# Patient Record
Sex: Male | Born: 1940 | Race: White | Hispanic: No | Marital: Married | Smoking: Never smoker
Health system: Southern US, Community
[De-identification: ages and names within clinical notes are randomized; demographics above are authoritative.]

## PROBLEM LIST (undated history)

## (undated) DIAGNOSIS — E785 Hyperlipidemia, unspecified: Secondary | ICD-10-CM

## (undated) DIAGNOSIS — E669 Obesity, unspecified: Secondary | ICD-10-CM

## (undated) DIAGNOSIS — F32A Depression, unspecified: Secondary | ICD-10-CM

## (undated) DIAGNOSIS — E119 Type 2 diabetes mellitus without complications: Secondary | ICD-10-CM

## (undated) DIAGNOSIS — M1990 Unspecified osteoarthritis, unspecified site: Secondary | ICD-10-CM

## (undated) DIAGNOSIS — E1140 Type 2 diabetes mellitus with diabetic neuropathy, unspecified: Secondary | ICD-10-CM

## (undated) DIAGNOSIS — I10 Essential (primary) hypertension: Secondary | ICD-10-CM

## (undated) DIAGNOSIS — F329 Major depressive disorder, single episode, unspecified: Secondary | ICD-10-CM

## (undated) DIAGNOSIS — G4730 Sleep apnea, unspecified: Secondary | ICD-10-CM

## (undated) DIAGNOSIS — G8929 Other chronic pain: Secondary | ICD-10-CM

## (undated) DIAGNOSIS — M791 Myalgia, unspecified site: Secondary | ICD-10-CM

## (undated) DIAGNOSIS — E079 Disorder of thyroid, unspecified: Secondary | ICD-10-CM

## (undated) HISTORY — DX: Other chronic pain: G89.29

## (undated) HISTORY — DX: Major depressive disorder, single episode, unspecified: F32.9

## (undated) HISTORY — DX: Disorder of thyroid, unspecified: E07.9

## (undated) HISTORY — DX: Type 2 diabetes mellitus without complications: E11.9

## (undated) HISTORY — DX: Obesity, unspecified: E66.9

## (undated) HISTORY — DX: Depression, unspecified: F32.A

## (undated) HISTORY — DX: Essential (primary) hypertension: I10

## (undated) HISTORY — DX: Myalgia, unspecified site: M79.10

## (undated) HISTORY — DX: Unspecified osteoarthritis, unspecified site: M19.90

## (undated) HISTORY — DX: Hyperlipidemia, unspecified: E78.5

## (undated) HISTORY — DX: Sleep apnea, unspecified: G47.30

## (undated) HISTORY — DX: Type 2 diabetes mellitus with diabetic neuropathy, unspecified: E11.40

---

## 1997-10-17 ENCOUNTER — Ambulatory Visit (HOSPITAL_COMMUNITY): Admission: RE | Admit: 1997-10-17 | Discharge: 1997-10-17 | Payer: Self-pay | Admitting: Specialist

## 1997-10-24 ENCOUNTER — Ambulatory Visit (HOSPITAL_COMMUNITY): Admission: RE | Admit: 1997-10-24 | Discharge: 1997-10-24 | Payer: Self-pay | Admitting: Specialist

## 2000-07-20 ENCOUNTER — Encounter: Admission: RE | Admit: 2000-07-20 | Discharge: 2000-07-20 | Payer: Self-pay | Admitting: Orthopedic Surgery

## 2000-07-20 ENCOUNTER — Encounter: Payer: Self-pay | Admitting: Orthopedic Surgery

## 2000-12-02 ENCOUNTER — Encounter: Payer: Self-pay | Admitting: Neurosurgery

## 2000-12-06 ENCOUNTER — Encounter: Payer: Self-pay | Admitting: Neurosurgery

## 2000-12-06 ENCOUNTER — Inpatient Hospital Stay (HOSPITAL_COMMUNITY): Admission: RE | Admit: 2000-12-06 | Discharge: 2000-12-06 | Payer: Self-pay | Admitting: Neurosurgery

## 2003-05-01 ENCOUNTER — Inpatient Hospital Stay (HOSPITAL_COMMUNITY): Admission: EM | Admit: 2003-05-01 | Discharge: 2003-05-04 | Payer: Self-pay | Admitting: Emergency Medicine

## 2003-06-27 ENCOUNTER — Encounter (INDEPENDENT_AMBULATORY_CARE_PROVIDER_SITE_OTHER): Payer: Self-pay | Admitting: *Deleted

## 2003-06-27 ENCOUNTER — Ambulatory Visit (HOSPITAL_COMMUNITY): Admission: RE | Admit: 2003-06-27 | Discharge: 2003-06-27 | Payer: Self-pay | Admitting: Gastroenterology

## 2003-08-29 ENCOUNTER — Ambulatory Visit (HOSPITAL_COMMUNITY): Admission: RE | Admit: 2003-08-29 | Discharge: 2003-08-29 | Payer: Self-pay | Admitting: Otolaryngology

## 2003-09-14 ENCOUNTER — Encounter (INDEPENDENT_AMBULATORY_CARE_PROVIDER_SITE_OTHER): Payer: Self-pay | Admitting: Specialist

## 2003-09-14 ENCOUNTER — Ambulatory Visit (HOSPITAL_COMMUNITY): Admission: RE | Admit: 2003-09-14 | Discharge: 2003-09-14 | Payer: Self-pay | Admitting: Otolaryngology

## 2003-12-07 ENCOUNTER — Encounter: Admission: RE | Admit: 2003-12-07 | Discharge: 2003-12-07 | Payer: Self-pay | Admitting: Otolaryngology

## 2004-01-09 ENCOUNTER — Ambulatory Visit (HOSPITAL_COMMUNITY): Admission: RE | Admit: 2004-01-09 | Discharge: 2004-01-10 | Payer: Self-pay | Admitting: Otolaryngology

## 2004-01-09 ENCOUNTER — Encounter (INDEPENDENT_AMBULATORY_CARE_PROVIDER_SITE_OTHER): Payer: Self-pay | Admitting: Specialist

## 2006-05-20 ENCOUNTER — Encounter: Admission: RE | Admit: 2006-05-20 | Discharge: 2006-05-20 | Payer: Self-pay | Admitting: *Deleted

## 2007-01-14 ENCOUNTER — Encounter: Admission: RE | Admit: 2007-01-14 | Discharge: 2007-01-14 | Payer: Self-pay | Admitting: *Deleted

## 2009-09-10 ENCOUNTER — Ambulatory Visit (HOSPITAL_COMMUNITY): Admission: RE | Admit: 2009-09-10 | Discharge: 2009-09-10 | Payer: Self-pay | Admitting: Gastroenterology

## 2009-10-10 ENCOUNTER — Ambulatory Visit (HOSPITAL_COMMUNITY): Admission: RE | Admit: 2009-10-10 | Discharge: 2009-10-10 | Payer: Self-pay | Admitting: Gastroenterology

## 2009-11-07 ENCOUNTER — Encounter (INDEPENDENT_AMBULATORY_CARE_PROVIDER_SITE_OTHER): Payer: Self-pay | Admitting: General Surgery

## 2009-11-07 ENCOUNTER — Observation Stay (HOSPITAL_COMMUNITY): Admission: RE | Admit: 2009-11-07 | Discharge: 2009-11-08 | Payer: Self-pay | Admitting: General Surgery

## 2010-05-08 LAB — COMPREHENSIVE METABOLIC PANEL
AST: 20 U/L (ref 0–37)
Albumin: 3.9 g/dL (ref 3.5–5.2)
BUN: 14 mg/dL (ref 6–23)
Calcium: 9.5 mg/dL (ref 8.4–10.5)
Creatinine, Ser: 1.02 mg/dL (ref 0.4–1.5)
GFR calc Af Amer: >60 mL/min (ref >60)
Total Protein: 6.5 g/dL (ref 6.0–8.3)

## 2010-05-08 LAB — DIFFERENTIAL
Eosinophils Relative: 4 % (ref 0–5)
Lymphocytes Relative: 32 % (ref 12–46)
Lymphs Abs: 2.8 10*3/uL (ref 0.7–4.0)
Monocytes Absolute: 0.8 10*3/uL (ref 0.1–1.0)
Neutro Abs: 4.6 10*3/uL (ref 1.7–7.7)

## 2010-05-08 LAB — GLUCOSE, CAPILLARY
Glucose-Capillary: 171 mg/dL — ABNORMAL HIGH (ref 70–99)
Glucose-Capillary: 210 mg/dL — ABNORMAL HIGH (ref 70–99)
Glucose-Capillary: 223 mg/dL — ABNORMAL HIGH (ref 70–99)
Glucose-Capillary: 238 mg/dL — ABNORMAL HIGH (ref 70–99)
Glucose-Capillary: 292 mg/dL — ABNORMAL HIGH (ref 70–99)

## 2010-05-08 LAB — CBC
MCH: 30.6 pg (ref 26.0–34.0)
MCHC: 34.3 g/dL (ref 30.0–36.0)
Platelets: 184 10*3/uL (ref 150–400)
RBC: 5.13 MIL/uL (ref 4.22–5.81)
RDW: 12.8 % (ref 11.5–15.5)

## 2010-07-11 NOTE — Cardiovascular Report (Signed)
NAMEOMARII, SCALZO                          ACCOUNT NO.:  [id number]   MEDICAL RECORD NO.:  [id number]                   PATIENT TYPE:  INP   LOCATION:  2001                                 FACILITY:  [facility]   PHYSICIAN:  Meade Maw, M.D.                 DATE OF BIRTH:  1940/08/20   DATE OF PROCEDURE:  DATE OF DISCHARGE:                              CARDIAC CATHETERIZATION   REFERRING PHYSICIAN:  Al Decant. Janey Greaser, M.D.   PROCEDURES PERFORMED:   CARDIOLOGIST:  Meade Maw, M.D.   INDICATIONS FOR PROCEDURE:  Ongoing chest pain, some dyspnea and T wave in  the inferior leads.   DESCRIPTION OF PROCEDURE:  After obtaining written informed consent the  patient was brought to the cardiac catheterization lab in the post  absorptive state.  Preop sedation was achieved using IV Versed.  The right  groin was prepped and draped in the usual sterile fashion.  Local anesthesia  was achieved using 1% Xylocaine.   A 6 French hemostasis sheath was placed into the right femoral artery using  the modified Seldinger technique.  Selective coronary angiography was  performed using JL-4 and JR-4 Judkins catheters.  Single-plane  ventriculogram was performed in the RAO position.  All catheter exchanges  were made over a guidewire.  The hemostasis sheath was flushed following  each catheter exchange.   After reviewing the left heart catheterization and angiography it was  elected to proceed with right heart catheterization to further define the  etiology of the patient's dyspnea.   An 8 [place] hemostasis sheath was placed into the right femoral vein.  Right  heart catheterization pressures were obtained from the pulmonary artery,  pulmonary wedge, right atrial and right ventricle.  Thermodilution cardiac  outputs were performed.  The Swan-Ganz catheter was then removed.  The films  were reviewed by Dr. Amil Amen.  There was no critical disease amendable for  intervention.   The patient was  transferred to the holding area.  Hemostasis was achieved  using digital pressure.   COMPLICATIONS:  There were no immediate complications.   FINDINGS:   FLUOROSCOPIC DATA:  Fluoroscopy revealed mild calcification in the proximal  left anterior descending.   HEMODYNAMIC DATA:  The aortic pressure was 125/69.  Left ventricular  pressure was 137/14, EDP was 22-22.  Right atrial pressure was 12.  Right  ventricular pressure was 42/9.  Pulmonary artery pressure was 41/17.  Pulmonary wedge pressure 18.  Cardiac output 5.6.  Cardiac index 2.14.   VENTRICULOGRAPHIC DATA:  Single-plane ventriculogram revealed normal wall  motion.  Ejection fraction of 65%.   ANGIOGRAPHIC DATA:  Coronary Angiography  Left Main Coronary Artery:  The left main coronary artery bifurcates into  the left anterior descending and circumflex vessel.  The left main coronary  artery is short.  There is no disease noted in the left main coronary  artery.   Left Anterior Descending:  The left anterior descending gives rise to a  large first diagonal, large second diagonal and goes on to end as an apical  branch.  There is a 40% lesion at the level of the second diagonal.   Circumflex Vessel:  The circumflex vessel is a large caliber vessel and  gives rise to a small OM-1, small OM-2, large OM-3 and goes on to end as a  lateral branch.  There are luminal irregularities in the circumflex of up to  20-30%.   Right Coronary Artery:  The right coronary artery is a large artery.  It is  somewhat ectatic in its origin and gives rise to an RV marginal, PDA and  posterolateral branch.  There is a 40% proximal lesion noted in the right  coronary artery.   FINAL IMPRESSION:  1. 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  There is no evidence of ischemia in this region.   The films were reviewed with Dr. Amil Amen.  Percutaneous revascularization  was felt not to be indicated.   Other etiologies for his chest pain should be  considered.   1. Normal wall motion.  2. Mild pulmonary hypertension with a pulmonary artery pressure of 41/17.                                               Meade Maw, M.D.    HP/MEDQ  D:  05/04/2003  T:  05/05/2003  Job:  [id number]

## 2010-07-11 NOTE — Op Note (Signed)
NAMEJAYDIS, DUCHENE                ACCOUNT NO.:  [id number]   MEDICAL RECORD NO.:  [id number]          PATIENT TYPE:  OIB   LOCATION:  2550                         FACILITY:  [facility]   PHYSICIAN:  Zola Button T. Lazarus Salines, M.D. DATE OF BIRTH:  05/29/1940   DATE OF PROCEDURE:  01/09/2004  DATE OF DISCHARGE:                                 OPERATIVE REPORT   PREOPERATIVE DIAGNOSIS:  Massive thyroid goiter with substernal extension  and dysphagia.   POSTOPERATIVE DIAGNOSIS:  Massive thyroid goiter with substernal extension and dysphagia.   PROCEDURE PERFORMED:  Total thyroidectomy.   SURGEON:  Gloris Manchester. Lazarus Salines, M.D.   ASSISTANT:  Suzanna Obey, M.D.   ANESTHESIA:  General orotracheal.   ESTIMATED BLOOD LOSS:  Less than 50 mL.   COMPLICATIONS:  None.   FINDINGS:  An overall lobulated and enlarged thyroid gland with a relatively  small isthmus, left lobe greater than right.  A separate accessory lobe of  thyroid tissue apparently not connected to the right lobe but behind the  right lobe and tracking down in the paraesophageal area, approximately 3 by  3 by 8 cm in total size.  No adenopathy palpated.   PROCEDURE:  With the patient in a comfortable supine position, a semi-awake  intubation was accomplished without difficulty.  At an appropriate level,  the patient had a shoulder roll placed for extension of the neck and was  placed in reversed Trendelenburg.  A sterile preparation and draping from  the chin to the pubis was performed in anticipation of possible need for a  sternotomy.  The lower neck was palpated with the findings as described  above.  A 10 cm transverse incision was made above the sternal notch and  aligned carefully for cosmesis.  This was sharply executed and carried  through the skin, abundant subcutaneous fat, and through the platysmal  layer.  Subplatysmal flaps were elevated superiorly and inferiorly.  The  flaps were retained with a Mahorner retractor.  The midline  raphe of the  strap muscle was divided in several layers and the muscles were dissected  free of the anterior face of the thyroid gland.   Beginning on the right side, the dissection was carried directly on the  capsule of the gland up the superior aspect to the superior pole.  Multiple  branches of arteries and veins were controlled with silk ligature and with  Liga clips.  Dissection was carried along the anterior surface of the gland  and beginning at the isthmus down towards the inferior pole.  The isthmus  was dissected free from the anterior face of the trachea and the dissection  was worked around the thyroid gland.  Upon freeing the superior pole, it was  carried down to the posterior aspect of the right lobe.  Upon rolling the  lobe medially and approaching the under surface, significant vessels were  encountered and were controlled directly on the capsule of the gland where  the entire dissection was carried out.  The recurrent laryngeal nerve was  not directly identified but the area of its entry into the  crico-tracheal  junction had been carefully dissected away from the capsule of the gland.  One potential parathyroid gland had been identified and was preserved.  With  the gland mobilized and remaining pedicle by the isthmus, the isthmus was  transected and the gland was sent for frozen section interpretation.  Hemostasis was observed.   The left lobe of the thyroid was addressed in a similar fashion working up  the anterior pole.  The soft tissue overlying the anterior thyroid cartilage  containing the pyramidal lobe of the thyroid tissues was dissected and  retained with the left lobe of the thyroid gland.  The lobe was larger on  this side but an easy plane was dissected digitally on the anterior and  lateral surface of the gland.  Working behind the superior pole and behind  the inferior pole, multiple vessels were dissected off the capsule gland and  controlled.  The  dissection was very clean and easy on the left side and,  once again, the recurrent laryngeal nerve was not identified but was not  dissected.  The thyroid lobe was delivered and sent for permanent section  interpretation.  Hemostasis was observed.  Note that vessels were controlled  with silk ligatures and also with Liga clips as required.   Reviewing the CT scan, it was not apparent that the direct extension of the  thyroid down towards the mediastinum had been encountered during surgery.  Careful palpation revealed another thyroid mass behind the thyroid bed on  the right side.  This was carefully dissected sharply and bluntly and, once  again, vessels were controlled with Liga clips and with silk ligature as  required.  Finally, the mass was delivered with digital dissection and  measured roughly 3 by 3 by 8 cm down into the mediastinum at least 4 cm.  Mild oozing was encountered but the site of the bleeding could not be  directly identified.  At this point, palpation revealed no adenopathy and no  further thyroid masses.  The remaining right thyroid mass was sent for  permanent interpretation by pathology.  The wound was thoroughly irrigated.  A flat perforated drain was placed into the mediastinal wound and thyroid  bed on the right side and into the thyroid bed on the left side, two  separate drains, and brought out through the incision.  The wound was  irrigated once again and the veins were applied to suction.  The midline  raphe of the strap muscles was approximated with 3-0 chromic sutures as was  the platysmal layer of the skin.  The head was flexed to allow the wound to  close more readily and the skin was closed with skin staples.  The drains  were secured with 2-0 silk sutures.  Hemostasis was observed and the drains  were functioning properly.  At this point, the procedure was completed.  Neosporin ointment was applied to the wound.  The drapes were carefully removed and  the patient was cleaned.  He was transferred to recovery and  from there to the step down unit given his history of sleep apnea.  We will  anticipate ice, elevation, analgesia, pulmonary toilet, oxygen  supplementation and saturation monitoring, diabetes monitoring, calcium  checks and observation.      Karo   KTW/MEDQ  D:  01/09/2004  T:  01/09/2004  Job:  [id number]   cc:   Al Decant. Janey Greaser, MD  [address]  [place]  [place] [address]  Fax: [phone]  Ines Bloomer, M.D.  [address]  [place]  [place] [address]
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
## 2010-07-11 NOTE — Op Note (Signed)
[name], [name]                          ACCOUNT NO.:  [id number]   MEDICAL RECORD NO.:  [id number]                   PATIENT TYPE:  AMB   LOCATION:  ENDO                                 FACILITY:  [facility]   PHYSICIAN:  Graylin Shiver, M.D.                DATE OF BIRTH:  08-May-1940   DATE OF PROCEDURE:  06/27/2003  DATE OF DISCHARGE:                                 OPERATIVE REPORT   PROCEDURE:  Esophagogastroduodenoscopy __________  .   INDICATIONS:  The patient is a 70 year old man __________  gastrointestinal  tract checked out.  __________  energy __________  , bloated, poor appetite.  He states that he __________  despite the __________  .  He will  occasionally see blood in the bowel movement but not much.  He is undergoing  EGD and colonoscopy __________  .   Informed consent was obtained after explanation of the risks of bleeding,  infection, and perforation.   PREMEDICATION:  Fentanyl 75 mcg IV, Versed 7.5 mg IV.   PROCEDURE:  With the patient in the left lateral decubitus position, the  Olympus gastroscope was inserted into the oropharynx and passed into the  esophagus.  It was advanced down the esophagus into the stomach and  duodenum.  The second portion and bulb __________  .  The stomach showed  some minimal antral gastritis.  There were a couple of tiny __________  .  The patient admits to taking aspirin __________  .  A biopsy for CLOtest was  obtained.  The body of the stomach was normal.  Fundus and cardia were  normal on retroflexion.  The esophagus was normal.  The esophagogastric  junction was __________  .  He tolerated the procedure well without  complications.   IMPRESSION:  Mild antral gastritis, probably secondary aspirin, biopsy for  CLOtest obtained.__________  .                                               Graylin Shiver, M.D.    Steven Gordon  D:  06/27/2003  T:  06/27/2003  Job:  [id number]

## 2010-07-11 NOTE — H&P (Signed)
NAME:  Steven Gordon, Steven Gordon                          ACCOUNT NO.:  [id number]   MEDICAL RECORD NO.:  [id number]                   PATIENT TYPE:  EMS   LOCATION:  MAJO                                 FACILITY:  [facility]   PHYSICIAN:  Meade Maw, M.D.                 DATE OF BIRTH:  08/30/1938   DATE OF ADMISSION:  05/01/2003  DATE OF DISCHARGE:                                HISTORY & PHYSICAL   CHIEF COMPLAINT:  Chest pain.   HISTORY OF PRESENT ILLNESS:  The patient is a 70 year old male with a  history of hypertension, obstructive sleep apnea, and noninsulin-dependent  diabetes mellitus.  He has complained of approximately a two-week-history of  ongoing chest discomfort that has been occurring at rest as well as with  physical activity.  It will worsen with physical activity.  He states that  it will last for only a few minutes, and describes it as a dull ache.  He  had been having previously sharp chest discomfort which was relieved with  sublingual nitroglycerin here in the office; however, because of the quality  of his pain, he has not tried any nitroglycerin at home on his own for this  dull pain.  He has associated radiation to the left arm, shortness of  breath, diaphoresis and a headache.  He has had considerable worsening  fatigue and weakness, especially over the last week.  His chest discomfort  has been occurring on a daily basis, sometimes many times a day, and has  been worsening in intensity over the past week.  He had a recent Cardiolite  study in December 2004.  It was negative for ischemia.  The ejection  fraction was 67%.  He last saw Dr. Meade Maw on March 21, 2003, again  complaining of some fatigue and dyspnea.  At that point, there was no clear  indication of ischemia, and monitoring continued; however, the patient  understood that if his symptoms worsened, he would be admitted for a heart  catheterization, to further define the coronary anatomy.   PAST  MEDICAL/SURGICAL HISTORY:  1. Noninsulin-dependent diabetes.  2. Hypertension.  3. Hyperlipidemia.  4. Obesity.  5. Obstructive sleep apnea, on CPAP.  6. Depression.  7. Status post L5 back surgery in 2001, secondary to spurs.  8. Status post appendectomy.  9. Status post bilateral cataract surgery.   ALLERGIES:  PENICILLIN.   CURRENT MEDICATIONS:  1. Altoprev 60 mg daily.  2. Amaryl 2 mg daily.  3. Aspirin 81 mg daily.  4. Avandia 4 mg daily.  5. Cymbalta 60 mg daily.  6. Lotrel 5/20 mg daily.  7. Metoprolol 25 mg b.i.d.  8. __________ 400 mg daily.  9. M.V.I. one daily.  10.      Wellbutrin 300 mg daily.   FAMILY HISTORY:  He has one brother who had a myocardial infarction in his  30s.   SOCIAL  HISTORY:  He is married.  No children.  No tobacco or alcohol.  He  works in [employer] in [employer] (physical [employer]).   REVIEW OF SYSTEMS:  Essentially as above, as stated in the HPI.  No  dizziness, lightheadedness, or near syncope.  No awareness of tachycardia or  arrhythmias.  No GI complaints.  Bowel habits are normal.  No melena or  hematuria.  No lower extremity claudication; however, he complains of  anterior lower extremity muscle aches, as well as some bilateral hip pain.  An extensive workup has been undertaken by primary care.   PHYSICAL EXAMINATION:  VITAL SIGNS:  Blood pressure 132/70, heart rate 82  and regular, weight 263 pounds.  GENERAL:  He is conscious and alert, well-oriented.  He appears somewhat  fatigued and mildly uncomfortable.  SKIN:  Warm and dry.  Normal color and temperature.  NECK:  No jugular venous distention, no cervical lymphadenopathy, no  thyromegaly.  LUNGS:  Clear to auscultation bilaterally.  HEART:  A regular rate and rhythm.  ABDOMEN:  Obese; however, soft and nontender.  Normal bowel sounds.  No  hepatosplenomegaly.  EXTREMITIES:  Trace edema noted at the socks bilaterally in the lower  extremities.  Pulses +2 bilaterally in all  extremities.  NEUROLOGIC:  No neurological deficits.  An electrocardiogram tracing today shows a sinus rhythm with a leftward  axis, which is new since his last tracing on February 20, 2003.  Q-waves are  now noted in lead III and aVF, which are also new since his last tracing.   ASSESSMENT/PLAN:  1. Unstable angina:  He will be admitted to the hospital to rule out a     myocardial infarction with serial enzymes.  We will repeat an     electrocardiogram in the morning.  He will be started on IV nitroglycerin     and IV heparin.  His outpatient medicines will be continued.  He will be     scheduled for a cardiac catheterization in the morning with Dr. Meade Maw.  She will do a left heart catheterization.  If not significant     obstructions are found, she will then proceed with a right heart     catheterization.  2. Hypertension, controlled.  3. Noninsulin-dependent diabetes:  Continue outpatient medications.  Add     sliding scale coverage as necessary.  4. Hyperlipidemia:  Continue outpatient medications.  5. Obstructive sleep apnea:  He may bring his CPAP from home and use.  The patient was given 2 sublingual nitroglycerin sprays in the office here,  with relief of his chest discomfort;  however, the pain did return shortly thereafter.  His vital signs were  stable with a blood pressure of 110/58 post-nitroglycerin.  Dr. Fraser Din also saw and examined the patient and agrees with the above  plan.      Adrian Saran, N.P.                        Meade Maw, M.D.    HB/MEDQ  D:  05/01/2003  T:  05/01/2003  Job:  [id number]   cc:   Al Decant. Janey Greaser, MD  [address]  [place]  [place] [address]  Fax: [phone]

## 2010-07-11 NOTE — Op Note (Signed)
[place]  Patient:    NORWOOD, QUEZADA Visit Number: [id number] MRN: [id number]          Service Type: SUR Location: 3000 [id number] Attending Physician:  Barton Fanny Dictated by:   Hewitt Shorts, M.D. Proc. Date: 12/06/00 Admit Date:  12/06/2000                             Operative Report  PREOPERATIVE DIAGNOSES: 1. Lumbar spondylosis. 2. Degenerative disc disease. 3. Facet hypertrophy. 4. Radiculopathy.  POSTOPERATIVE DIAGNOSES: 1. Lumbar spondylosis. 2. Degenerative disc disease. 3. Facet hypertrophy. 4. Radiculopathy.  PROCEDURE:  Bilateral L5-S1 lumbar laminotomies and foraminotomies with microdissection and microsurgery.  SURGEON:  Hewitt Shorts, M.D.  ASSISTANT:  Cristi Loron, M.D.  ANESTHESIA:  General endotracheal.  INDICATIONS FOR PROCEDURE:  The patient is a 70 year old man who presented with bilateral lumbar radiculopathy, left worse than right, who was found to have advanced facet arthropathy causing lateral recess encroachment and nerve root compression.  A decision was made to proceed with elective laminotomies and foraminotomies.  DESCRIPTION OF PROCEDURE:  The patient was brought to the operating room, placed under general endotracheal anesthesia.  The patient was turned to a prone position.  The lumbar region was prepped with Betadine solution and draped in a sterile fashion.  The midline was infiltrated with local anesthetic with epinephrine.  A localizing x-ray was taken, the L5-S1 level identified.  Midline incision was made, carried down through the subcutaneous tissue.  Bipolar cautery and electrocautery used to maintain hemostasis. Dissection was carried down the lumbar fascia which was incised bilaterally, and the paraspinous muscles were dissected, spinous process and lamina in subperiosteal fashion.  The L5-S1 interlaminar space was identified.  Another x-ray was taken to  confirm the localization, and then the microscope was draped and brought into the field to provide additional magnification, illumination, and visualization, and the remainder of the procedure was performed using microdissection and microsurgical technique.  Bilateral laminotomies were performed using the black ______ and Kerrison punches.  The ligament of flavum was carefully removed, and the underlying thecal sac and nerve roots identified.  In the lateral epidural space we were able to identify overgrown hypertrophic facet that was encroaching upon the L5-S1 foramen bilaterally.  This was carefully decompressed, and we examined the foramen both the L5 and S1 nerve roots bilaterally, all the overgrown bone and ligament tissue was removed.  We were able to obtain good decompression of the nerve roots and thecal sac bilaterally.  Once the decompression was completed, we established hemostasis with the use of bipolar cautery, as well as Gelfoam soaked in thrombin.  Once hemostasis was established, the Gelfoam was removed, and 2 cc of phentanyl were instilled into the epidural space, and then we proceeded with closure.  The deep fascia was closed with interrupted undyed 0 Vicryl sutures, the subcutaneous and subcuticular closed with interrupted inverted 2-0 undyed Vicryl sutures, and the skin was reapproximated with Durabond.  The patient tolerated the procedure well.  Estimated blood loss was 200 cc.  Sponge and needle count were correct.  Following surgery, the patient was turned back to a supine position, and reversed from the anesthetic, extubated, and transferred to the recovery room for further care. Dictated by:   Hewitt Shorts, M.D. Attending Physician:  Barton Fanny DD:  12/06/00 TD:  12/06/00 Job: [id number]

## 2010-07-11 NOTE — Op Note (Signed)
Steven Gordon, Steven Gordon                          ACCOUNT NO.:  [id number]   MEDICAL RECORD NO.:  [id number]                   PATIENT TYPE:  AMB   LOCATION:  ENDO                                 FACILITY:  [facility]   PHYSICIAN:  Graylin Shiver, M.D.                DATE OF BIRTH:  11-23-1940   DATE OF PROCEDURE:  06/27/2003  DATE OF DISCHARGE:                                 OPERATIVE REPORT   PROCEDURE:  Colonoscopy with polypectomy.   INDICATIONS:  Intermittent rectal bleeding, screening.   Informed consent was obtained after explanation of the risks of bleeding,  infection, and perforation.   PREMEDICATION:  The procedure was done immediately after an EGD with an  additional 25 mcg of fentanyl and 2.5 mg of Versed given.   PROCEDURE:  With the patient in the left lateral decubitus position, a  rectal exam was performed, no masses were felt.  The Olympus colonoscope was  inserted into the rectum and advanced around the colon to the cecum.  Cecal  landmarks were identified.  The cecum looked normal.  The distal ascending  colon revealed an 8 mm slightly pedunculated polyp, which was snared and  removed by snare cautery technique.  The cautery site looked good and the  polyp was retrieved.  The transverse colon looked normal.  The descending  colon and sigmoid revealed diverticulosis.  The rectum was normal.  He  tolerated the procedure well without complications.   IMPRESSION:  1. Diverticulosis of the left colon.  2. Polyp in the ascending colon.   PLAN:  The pathology will be checked.                                               Graylin Shiver, M.D.    [name]  D:  06/27/2003  T:  06/27/2003  Job:  [id number]   cc:   Dr. Janey Greaser

## 2010-07-11 NOTE — Discharge Summary (Signed)
Steven Gordon, Steven Gordon                          ACCOUNT NO.:  [id number]   MEDICAL RECORD NO.:  [id number]                   PATIENT TYPE:  INP   LOCATION:  2001                                 FACILITY:  [facility]   PHYSICIAN:  Meade Maw, M.D.                 DATE OF BIRTH:  [birth year]   DATE OF ADMISSION:  05/01/2003  DATE OF DISCHARGE:  05/04/2003                                 DISCHARGE SUMMARY   ADMISSION DIAGNOSES:  1. Chest pain, rule out myocardial infarction.  2. Hypertension.  3. Non-insulin-dependent diabetes.  4. OSA.  5. Obesity.   DISCHARGE DIAGNOSES:  1. Chest pain, negative to myocardial infarction; non-critical coronary     artery disease by cardiac catheterization, 05/04/03.  2. Hypertension, marginal control.  3. OSA, using CPAP.  4. Obesity.  5. Headache, workup by neurology.   PROCEDURE:  Left heart catheterization, 05/04/03.   COMPLICATIONS:  None.   DISCHARGE STATUS:  Stable.   ADMISSION HISTORY:  Please see complete H and P for details.  In short, this  is a 70 year old male who was admitted on 05/01/03 with a 2-week history of  chest pain that had been worse the week prior to admission.  It was  associated with shortness of breath as well as generalized weakness and  fatigue, diaphoresis and headache.  He has all of these symptoms at rest as  well as with activity.  The headaches have been chronic for the last several  months.   PHYSICAL EXAMINATION:  On admission, please see complete H and P for  details.  In short, vital signs were stable and he was afebrile.  He  appeared somewhat fatigued but in no acute distress.  No significant  abnormalities to his physical exam other than morbid obesity.  Had trace  edema of the lower extremities.   LABORATORY DATA:  EKG shows sinus rhythm within the left axis.  Q wave in  the inferior leads, III and aVF, which were new since his last tracing on  02/20/03.   Admission labs including CBC, CMP, PT, PTT and  cardiac enzymes were all  normal with the exception of hyperglycemia at 185.  Initial cardiac enzymes  showed a total CK of 160 with 2.5 MB, relative index of 1.6 and a troponin  of 0.01.   Chest x-ray showed probable COPD and cardiomegaly.  There was also an area  in the superior aspect of his right lung that was suspicious for a  mediastinal mass.  Because of this, he underwent CT scan of the chest which  showed that the questioned mass on the plain film was actually an extension  of the right lobe of the thyroid.  There was no adenopathy or worrisome  lesion.   Because of his complaints of chronic headache, a CT scan of the head was  also performed which showed only mild cortical atrophy and no  acute  abnormality.   HOSPITAL COURSE:  The patient was admitted to rule out MI.  Plans for  cardiac catheterization ensued.   The patient continued to complain of sharp chest discomfort during the  night.  IV nitroglycerin was titrated up.  Vital signs remained stable.  He  was also complaining of a headache and was given morphine p.r.n. with  relief.  IV nitroglycerin was actually discontinued with serial cardiac  enzymes all being normal at this point.  IV heparin was also discontinued.  He was switched to subcu Lovenox.  Cardiac catheterization was delayed  secondary to need for chest and head CT scan, both of which are reported as  above.   While waiting for cardiac catheterization, he had no further complaints of  chest discomfort.  He underwent abdominal ultrasound to rule out gallbladder  disease.  This was done on 05/03/03 and was normal except for some fatty  infiltration of the liver.  Complaints of severe headache continued.  At  this point neurology was consulted.  The patient was seen by Dr. Sandria Manly on  05/04/03.  He changed some of his medications including stopping Provigil.  He will be treated on Topamax as an outpatient.   On 05/04/03, the patient was taken to the cardiac  catheterization lab by Dr.  Fraser Din.  Results showed left main to be normal.  LAD had an area of 40% at  the level of the take-off of the second diagonal.  Otherwise was normal.  Circumflex was essentially disease-free with the exception of some minor  luminal irregularities of up to 20-30%.  The RCA had a proximal area of 40%,  otherwise normal.  He was noted to have mild pulmonary hypertension with a  pulmonary artery pressure of 41/17.   The patient was feeling much improved and was actually discharged later that  same day, post-catheterization on 05/04/03.   DISCHARGE MEDICATIONS:  1. Protonix 40 mg daily.  2. __________ at previous home dose.  3. Aspirin 81 mg daily.  4. Lotrel as taken previously.  5. Cymbalta 60 mg daily.  6. Lopressor 25 mg b.i.d.  7. Wellbutrin XL 300 mg daily.  8. Avandia 4 mg daily.  9. Amaryl 4 mg daily.   DISCHARGE INSTRUCTIONS:  The patient was instructed not to undergo any  strenuous bending, stooping or activity and not to lift anything heavier  than 5 pounds for the next 2 days.  He was instructed that he may take a  shower the following day.  However, is not to soak in the bathtub for the  next several days.   DISCHARGE DIET:  He is to maintain a low fat, low cholesterol diet as well  as his diabetic diet restrictions.   FOLLOW UP:  He has an appointment to see Dr. Fraser Din on 05/22/03 at 2 p.m.  He is to see Dr. Janey Greaser for followup in the next 2-3 weeks.  We recommend a  GI evaluation as an outpatient.      Adrian Saran, N.P.                        Meade Maw, M.D.    HB/MEDQ  D:  05/31/2003  T:  06/01/2003  Job:  [id number]

## 2014-07-09 LAB — BASIC METABOLIC PANEL
BUN: 22 mg/dL — AB (ref 4–21)
Creatinine: 1.2 mg/dL (ref 0.6–1.3)
GLUCOSE: 156 mg/dL
Potassium: 4.7 mmol/L (ref 3.4–5.3)
SODIUM: 138 mmol/L (ref 137–147)

## 2014-07-09 LAB — LIPID PANEL
Cholesterol: 177 mg/dL (ref 0–200)
HDL: 14 mg/dL — AB (ref 35–70)
LDL/HDL RATIO: 5.4
TRIGLYCERIDES: 475 mg/dL — AB (ref 40–160)

## 2014-07-09 LAB — HEMOGLOBIN A1C: Hgb A1c MFr Bld: 7.9 % — AB (ref 4.0–6.0)

## 2014-07-20 ENCOUNTER — Encounter: Payer: Self-pay | Admitting: Internal Medicine

## 2014-07-20 ENCOUNTER — Ambulatory Visit (INDEPENDENT_AMBULATORY_CARE_PROVIDER_SITE_OTHER): Payer: Self-pay | Admitting: Internal Medicine

## 2014-07-20 VITALS — BP 144/80 | HR 69 | Temp 97.5°F | Resp 12 | Ht 72.0 in | Wt 303.0 lb

## 2014-07-20 DIAGNOSIS — E1142 Type 2 diabetes mellitus with diabetic polyneuropathy: Secondary | ICD-10-CM

## 2014-07-20 DIAGNOSIS — E1140 Type 2 diabetes mellitus with diabetic neuropathy, unspecified: Secondary | ICD-10-CM

## 2014-07-20 DIAGNOSIS — E1165 Type 2 diabetes mellitus with hyperglycemia: Principal | ICD-10-CM

## 2014-07-20 MED ORDER — METFORMIN HCL ER 500 MG PO TB24: 1000.0000 mg | ORAL_TABLET | Freq: Every day | ORAL | Status: DC

## 2014-07-20 NOTE — Patient Instructions (Addendum)
Please stop the regular metformin and start Metformin XR 1000 mg in am and 1000 mg in pm (with dinner).  Decrease Lantus to 50 units at bedtime.  Change R insulin to: - 8 units with a smaller meal - 12 units with a regular meal - 16 units with a larger meal  Please take the R insulin 30 min before you eat.  At next visit, we can change from Lantus to insulin N.  Please return in 1.5 months with your sugar log.   PATIENT INSTRUCTIONS FOR TYPE 2 DIABETES:  **Please join MyChart!** - see attached instructions about how to join if you have not done so already.  DIET AND EXERCISE Diet and exercise is an important part of diabetic treatment.  We recommended aerobic exercise in the form of brisk walking (working between 40-60% of maximal aerobic capacity, similar to brisk walking) for 150 minutes per week (such as 30 minutes five days per week) along with 3 times per week performing 'resistance' training (using various gauge rubber tubes with handles) 5-10 exercises involving the major muscle groups (upper body, lower body and core) performing 10-15 repetitions (or near fatigue) each exercise. Start at half the above goal but build slowly to reach the above goals. If limited by weight, joint pain, or disability, we recommend daily walking in a swimming pool with water up to waist to reduce pressure from joints while allow for adequate exercise.    BLOOD GLUCOSES Monitoring your blood glucoses is important for continued management of your diabetes. Please check your blood glucoses 2-4 times a day: fasting, before meals and at bedtime (you can rotate these measurements - e.g. one day check before the 3 meals, the next day check before 2 of the meals and before bedtime, etc.).   HYPOGLYCEMIA (low blood sugar) Hypoglycemia is usually a reaction to not eating, exercising, or taking too much insulin/ other diabetes drugs.  Symptoms include tremors, sweating, hunger, confusion, headache, etc. Treat  IMMEDIATELY with 15 grams of Carbs: . 4 glucose tablets .  cup regular juice/soda . 2 tablespoons raisins . 4 teaspoons sugar . 1 tablespoon honey Recheck blood glucose in 15 mins and repeat above if still symptomatic/blood glucose <100.  RECOMMENDATIONS TO REDUCE YOUR RISK OF DIABETIC COMPLICATIONS: * Take your prescribed MEDICATION(S) * Follow a DIABETIC diet: Complex carbs, fiber rich foods, (monounsaturated and polyunsaturated) fats * AVOID saturated/trans fats, high fat foods, >2,300 mg salt per day. * EXERCISE at least 5 times a week for 30 minutes or preferably daily.  * DO NOT SMOKE OR DRINK more than 1 drink a day. * Check your FEET every day. Do not wear tightfitting shoes. Contact us if you develop an ulcer * See your EYE doctor once a year or more if needed * Get a FLU shot once a year * Get a PNEUMONIA vaccine once before and once after age 42 years  GOALS:  * Your Hemoglobin A1c of <7%  * fasting sugars need to be <130 * after meals sugars need to be <180 (2h after you start eating) * Your Systolic BP should be 735 or lower  * Your Diastolic BP should be 80 or lower  * Your HDL (Good Cholesterol) should be 40 or higher  * Your LDL (Bad Cholesterol) should be 100 or lower. * Your Triglycerides should be 150 or lower  * Your Urine microalbumin (kidney function) should be <30 * Your Body Mass Index should be 25 or lower    Please  consider the following ways to cut down carbs and fat and increase fiber and micronutrients in your diet: - substitute whole grain for white bread or pasta - substitute brown rice for white rice - substitute 90-calorie flat bread pieces for slices of bread when possible - substitute sweet potatoes or yams for white potatoes - substitute humus for margarine - substitute tofu for cheese when possible - substitute almond or rice milk for regular milk (would not drink soy milk daily due to concern for soy estrogen influence on breast cancer  risk) - substitute dark chocolate for other sweets when possible - substitute water - can add lemon or orange slices for taste - for diet sodas (artificial sweeteners will trick your body that you can eat sweets without getting calories and will lead you to overeating and weight gain in the long run) - do not skip breakfast or other meals (this will slow down the metabolism and will result in more weight gain over time)  - can try smoothies made from fruit and almond/rice milk in am instead of regular breakfast - can also try old-fashioned (not instant) oatmeal made with almond/rice milk in am - order the dressing on the side when eating salad at a restaurant (pour less than half of the dressing on the salad) - eat as little meat as possible - can try juicing, but should not forget that juicing will get rid of the fiber, so would alternate with eating raw veg./fruits or drinking smoothies - use as little oil as possible, even when using olive oil - can dress a salad with a mix of balsamic vinegar and lemon juice, for e.g. - use agave nectar, stevia sugar, or regular sugar rather than artificial sweateners - steam or broil/roast veggies  - snack on veggies/fruit/nuts (unsalted, preferably) when possible, rather than processed foods - reduce or eliminate aspartame in diet (it is in diet sodas, chewing gum, etc) Read the labels!  Try to read Dr. Janene Harvey book: "Program for Reversing Diabetes" for other ideas for healthy eating.

## 2014-07-20 NOTE — Progress Notes (Signed)
Patient ID: Steven Gordon, male   DOB: 1941/02/10, 74 y.o.   MRN: [id number]  HPI: Steven Gordon is a 74 y.o.-year-old male, referred by his PCP, Dr. Drema Dallas, for management of DM2, dx in `2000, insulin-dependent, uncontrolled, with complications (PN). He is in the donut hole.  Last hemoglobin A1c was: Lab Results  Component Value Date   HGBA1C 7.9* 07/09/2014  02/20/2014: 9.7%  Pt is on a regimen of: - Metformin 1500 mg in am and 1000 mg in pm >> nausea! - Lantus 25 units in am and 75 units in hs >> 61 units at bedtime (decreased 02/2014 after last Hba1c as he started to change his diet) - R insulin 16 units before L   Pt checks his sugars 2x a day and they are: - am: 130-140 - 2h after b'fast: n/c - before lunch: n/c - 2h after lunch: n/c - before dinner: 180-190 - 2h after dinner: n/c - bedtime: n/c - nighttime: n/c No lows. Lowest sugar was 70; he has hypoglycemia awareness at 70.  Highest sugar was 330.  Glucometer: One Touch Ultra  Pt's meals are: - Breakfast: gravy biscuit ([employer]) - Lunch: meat and veggies - Dinner: stir fry or meat and veggies - Snacks: 1 at night - dessert 3x a week: vanilla wafers  - + mild CKD, last BUN/creatinine:  Lab Results  Component Value Date   BUN 22* 07/09/2014   CREATININE 1.2 07/09/2014  On Lisinopril. - last set of lipids: Lab Results  Component Value Date   CHOL 177 07/09/2014   HDL 14* 07/09/2014   TRIG 475* 07/09/2014  On Omega 3 FA - last eye exam was in 04/2014. Dr Bing Plume. ? DR.  - no numbness and tingling in his feet. On high dose neurontin - 800 mg 3x a day.  Pt has FH of DM in mother.   ROS: Constitutional: no weight gain/loss, + fatigue, no subjective hyperthermia/hypothermia Eyes: no blurry vision, no xerophthalmia ENT: no sore throat, no nodules palpated in throat, no dysphagia/odynophagia, no hoarseness, + decreased hearing Cardiovascular: no CP/SOB/palpitations/leg swelling Respiratory: + cough/no  SOB Gastrointestinal: + N/no V/+ D/no C Musculoskeletal: no muscle/joint aches Skin: no rashes Neurological: + tremors/+ numbness/+ tingling/no dizziness Psychiatric: no depression/anxiety + diff with erections  Past Medical History  Diagnosis Date  . Diabetes mellitus without complication     Type 2  . Obesity   . Sleep apnea   . Osteoarthritis   . Chronic pain   . Depression     Chronic depression  . Hyperlipidemia   . Myalgia   . Thyroid disease     hypothyroid; goiter  . Hypertension   . Diabetic neuropathy    No past surgical history on file. History   Social History Main Topics  . Smoking status: Never Smoker   . Smokeless tobacco: Not on file  . Alcohol Use: No  . 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 aspirin 325 MG EC tablet      1/2 tablet         . Cholecalciferol (VITAMIN D) 2000 UNITS CAPS      1 tablet with a meal         . fenofibrate 54 MG tablet  1 tablet with a meal         . gabapentin (NEURONTIN) 800 MG tablet   Oral   Take 800 mg by mouth 3 (three) times daily.       0   . Ginkgo Biloba 120 MG CAPS      1 capsule         . Glucose Blood (BAYER CONTOUR NEXT TEST VI)   Other   2 each by Other route daily.         Marland Kitchen HYDROcodone-acetaminophen (NORCO/VICODIN) 5-325 MG per tablet   Oral   Take 1 tablet by mouth every 8 (eight) hours as needed for moderate pain.         . Insulin Glargine (LANTUS) 100 UNIT/ML Solostar Pen      INJECT 25U SQ IN THE MORNING AND 75U SQ IN THE EVENING.         . Insulin Pen Needle 31G X 8 MM MISC   Does not apply   by Does not apply route.         . insulin regular (NOVOLIN R,HUMULIN R) 100 units/mL injection      INJECT 8 UNITS SUBCUTANEOUSLY BEFORE LUNCH AND DINNER.         Marland Kitchen levothyroxine  (SYNTHROID, LEVOTHROID) 200 MCG tablet            4   . lisinopril-hydrochlorothiazide (PRINZIDE,ZESTORETIC) 20-12.5 MG per tablet      TAKE (2) TABLETS DAILY.         Marland Kitchen MANGANESE PO      1 tablet         . metFORMIN (GLUCOPHAGE) 1000 MG tablet      TAKE 1 & 1/2 TABLETS IN THE MORNING AND 1 EACH EVENING AS DIRECTED.         . Multiple Vitamins-Minerals (CENTRUM SILVER PO)   Oral   Take 1 tablet by mouth daily.         . Omega 3 1000 MG CAPS      1 capsule with a meal         . promethazine (PHENERGAN) 25 MG tablet   Oral   Take 25 mg by mouth every 6 (six) hours as needed for nausea or vomiting.          Allergies  Allergen Reactions  . Metformin Hcl Nausea Only  . Penicillins Rash   FH: - DM in mother  PE: BP 144/80 mmHg  Pulse 69  Temp(Src) 97.5 F (36.4 C) (Oral)  Resp 12  Ht 6' (1.829 m)  Wt 303 lb (137.44 kg)  BMI 41.09 kg/m2  SpO2 95% Wt Readings from Last 3 Encounters:  07/20/14 303 lb (137.44 kg)   Constitutional: overweight, in NAD Eyes: PERRLA, EOMI, no exophthalmos ENT: moist mucous membranes, no thyromegaly, surgical scar healed lower neck, no cervical lymphadenopathy Cardiovascular: RRR, No MRG Respiratory: CTA B Gastrointestinal: abdomen soft, NT, ND, BS+ Musculoskeletal: no deformities, strength intact in all 4 Skin: moist, warm, no rashes Neurological: no tremor with outstretched hands, DTR normal in all 4  ASSESSMENT: 1. DM2, insulin-dependent, uncontrolled, with complications - PN  PLAN:  1. Patient with long-standing, uncontrolled diabetes, on oral antidiabetic regimen, which became insufficient. He has improved his diet since last visit >> sugars decreased despite having reduced his Lantus insulin by 40%. He is taking all regular insulin before lunch >> will need to split this throughout the day. Also, will make the corresponding change in Lantus >> decrease  to 50 units. Since he has nausea with regular metformin >>  switch to ER and decrease the dose to 1000 mg bid.  At next visit >> need to change to [employer] insulins as the Lantus (and also R) is too expensive.  - I suggested to:  Patient Instructions  Please stop the regular metformin and start Metformin XR 1000 mg in am and 1000 mg in pm (with dinner).  Decrease Lantus to 50 units at bedtime.  Change R insulin to: - 8 units with a smaller meal - 12 units with a regular meal - 16 units with a larger meal  Please take the R insulin 30 min before you eat.  At next visit, we can change from Lantus to insulin N.  Please return in 1.5 months with your sugar log.   - continue checking sugars at different times of the day - check 2 times a day, rotating checks - given sugar log and advised how to fill it and to bring it at next appt  - given foot care handout and explained the principles  - given instructions for hypoglycemia management "15-15 rule"  - advised for yearly eye exams >> had a recent one - Return to clinic in 1.5 mo with sugar log

## 2014-09-24 ENCOUNTER — Ambulatory Visit (INDEPENDENT_AMBULATORY_CARE_PROVIDER_SITE_OTHER): Payer: Self-pay | Admitting: Internal Medicine

## 2014-09-24 ENCOUNTER — Encounter: Payer: Self-pay | Admitting: Internal Medicine

## 2014-09-24 VITALS — BP 124/70 | HR 68 | Temp 98.8°F | Resp 12 | Wt 306.0 lb

## 2014-09-24 DIAGNOSIS — E1140 Type 2 diabetes mellitus with diabetic neuropathy, unspecified: Secondary | ICD-10-CM

## 2014-09-24 DIAGNOSIS — E1142 Type 2 diabetes mellitus with diabetic polyneuropathy: Secondary | ICD-10-CM

## 2014-09-24 DIAGNOSIS — E1165 Type 2 diabetes mellitus with hyperglycemia: Principal | ICD-10-CM

## 2014-09-24 MED ORDER — GLUCOSE BLOOD VI STRP: ORAL_STRIP | Status: DC

## 2014-09-24 MED ORDER — HUMULIN N 100 UNIT/ML ~~LOC~~ SUSP: SUBCUTANEOUS | Status: DC

## 2014-09-24 MED ORDER — INSULIN REGULAR HUMAN 100 UNIT/ML IJ SOLN: INTRAMUSCULAR | Status: DC

## 2014-09-24 MED ORDER — METFORMIN HCL ER 500 MG PO TB24: 500.0000 mg | ORAL_TABLET | Freq: Every day | ORAL | Status: DC

## 2014-09-24 NOTE — Patient Instructions (Signed)
Please decrease Metformin ER to 500 mg 2x a day. If you can, add a third dose for lunch or at bedtime.  Stop Lantus.  Start insulin NPH (insulin N): Insulin Before breakfast Before lunch Before dinner  Regular 16 units - regular meal 20 units - larger meal 16 units - regular meal 20 units - larger meal 16 units - regular meal 20 units - larger meal  NPH 30 - 20  Please inject the insulin 30 min before meals.  Please mix the insulins in the same column in the same syringe.  Please return in 1.5 months with your sugar log.

## 2014-09-24 NOTE — Progress Notes (Signed)
Patient ID: Steven Gordon, male   DOB: [birth year], 74 y.o.   MRN: [id number]  HPI: Steven Gordon is a 74 y.o.-year-old male, returning for f/u for DM2, dx in 2000, insulin-dependent, uncontrolled, with complications (PN). He is late 10 min for his 15 min appt. He is in the donut hole.  Last hemoglobin A1c was: Lab Results  Component Value Date   HGBA1C 7.9* 07/09/2014  02/20/2014: 9.7%  Pt is on a regimen of: - Metformin XR 1000 mg in am and 1000 mg in pm  - Lantus 25 units in am and 75 units in hs >> 61 units at bedtime >> 50 units at bedtime. - R insulin: - 8 units with a smaller meal - 12 units with a regular meal - 16 units with a larger meal  At this visit, we need to change from Lantus to NPH b/c donut hole.  Pt checks his sugars 2x a day and they are: - am: 130-140 >> 140-150 - 2h after b'fast: n/c - before lunch: n/c - 2h after lunch: n/c - before dinner: 180-190 >> 180-190 - 2h after dinner: n/c - bedtime: n/c - nighttime: n/c No lows. Lowest sugar was 70 >> 90s; he has hypoglycemia awareness at 70.  Highest sugar was 330 >> 460.  Glucometer: One Touch Ultra  Pt's meals are: - Breakfast: gravy biscuit ([employer]) - Lunch: meat and veggies - Dinner: stir fry or meat and veggies - Snacks: 1 at night - dessert 3x a week: vanilla wafers  - + mild CKD, last BUN/creatinine:  Lab Results  Component Value Date   BUN 22* 07/09/2014   CREATININE 1.2 07/09/2014  On Lisinopril. - last set of lipids: Lab Results  Component Value Date   CHOL 177 07/09/2014   HDL 14* 07/09/2014   TRIG 475* 07/09/2014  On Omega 3 FA - last eye exam was in 04/2014. Dr Bing Plume. ? DR.  - no numbness and tingling in his feet. On high dose neurontin - 800 mg 3x a day.  ROS: Constitutional: no weight gain/loss, + fatigue, no subjective hyperthermia/hypothermia Eyes: no blurry vision, no xerophthalmia ENT: no sore throat, no nodules palpated in throat, no dysphagia/odynophagia, no  hoarseness, + decreased hearing Cardiovascular: no CP/SOB/palpitations/leg swelling Respiratory: no cough/no SOB Gastrointestinal: + N/no V/+ D/no C Musculoskeletal: no muscle/joint aches Skin: no rashes Neurological: + tremors/no numbness/ tingling/no dizziness  I reviewed pt's medications, allergies, PMH, social hx, family hx, and changes were documented in the history of present illness. Otherwise, unchanged from my initial visit note:  Past Medical History  Diagnosis Date  . Diabetes mellitus without complication     Type 2  . Obesity   . Sleep apnea   . Osteoarthritis   . Chronic pain   . Depression     Chronic depression  . Hyperlipidemia   . Myalgia   . Thyroid disease     hypothyroid; goiter  . Hypertension   . Diabetic neuropathy    No past surgical history on file. History   Social History Main Topics  . Smoking status: Never Smoker   . Smokeless tobacco: Not on file  . Alcohol Use: No  . 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 aspirin 325  MG EC tablet      1/2 tablet         . Cholecalciferol (VITAMIN D) 2000 UNITS CAPS      1 tablet with a meal         . fenofibrate 54 MG tablet      1 tablet with a meal         . gabapentin (NEURONTIN) 800 MG tablet   Oral   Take 800 mg by mouth 3 (three) times daily.       0   . Ginkgo Biloba 120 MG CAPS      1 capsule         . Glucose Blood (BAYER CONTOUR NEXT TEST VI)   Other   2 each by Other route daily.         Marland Kitchen HYDROcodone-acetaminophen (NORCO/VICODIN) 5-325 MG per tablet   Oral   Take 1 tablet by mouth every 8 (eight) hours as needed for moderate pain.         . Insulin Glargine (LANTUS) 100 UNIT/ML Solostar Pen      50 Units. INJECT 50 Units SQ IN THE EVENING.         . Insulin Pen Needle 31G X 8 MM  MISC   Does not apply   by Does not apply route.         . insulin regular (NOVOLIN R,HUMULIN R) 100 units/mL injection      INJECT 8-16 units 30 min before the 3 meals         . levothyroxine (SYNTHROID, LEVOTHROID) 200 MCG tablet            4   . lisinopril-hydrochlorothiazide (PRINZIDE,ZESTORETIC) 20-12.5 MG per tablet      TAKE (2) TABLETS DAILY.         Marland Kitchen MANGANESE PO      1 tablet         . metFORMIN (GLUCOPHAGE-XR) 500 MG 24 hr tablet   Oral   Take 2 tablets (1,000 mg total) by mouth daily with breakfast.   360 tablet   1   . Multiple Vitamins-Minerals (CENTRUM SILVER PO)   Oral   Take 1 tablet by mouth daily.         . Omega 3 1000 MG CAPS      1 capsule with a meal         . promethazine (PHENERGAN) 25 MG tablet   Oral   Take 25 mg by mouth every 6 (six) hours as needed for nausea or vomiting.          Allergies  Allergen Reactions  . Metformin Hcl Nausea Only  . Penicillins Rash   FH: - DM in mother  PE: BP 124/70 mmHg  Pulse 68  Temp(Src) 98.8 F (37.1 C) (Oral)  Resp 12  Wt 306 lb (138.801 kg)  SpO2 96% Body mass index is 41.49 kg/(m^2). Wt Readings from Last 3 Encounters:  09/24/14 306 lb (138.801 kg)  07/20/14 303 lb (137.44 kg)   Constitutional: overweight, in NAD Eyes: PERRLA, EOMI, no exophthalmos ENT: moist mucous membranes, no thyromegaly, surgical scar healed lower neck, no cervical lymphadenopathy Cardiovascular: RRR, No MRG Respiratory: CTA B Gastrointestinal: abdomen soft, NT, ND, BS+ Musculoskeletal: no deformities, strength intact in all 4 Skin: moist, warm, no rashes Neurological: no tremor with outstretched hands, DTR normal in all 4  ASSESSMENT: 1. DM2, insulin-dependent, uncontrolled, with complications - PN  PLAN:  1. Patient with long-standing, uncontrolled  diabetes, on basal-bolus insulin regimen + metformin. At last visit, as his sugars were decreasing, we decreased Lantus. Also, he was taking  all regular insulin before lunch >> we split this throughout the day. Sugars now ~same as before, showing a staircase increase throughout the day, a sign of not enough mealtime insulin. Will increase R insulin and we also need to switch to NPH  - explained how to mix them. Also, since even Metformin XR gives him nausea >> will decrease the dose. - I suggested to:  Patient Instructions   Please decrease Metformin ER to 500 mg 2x a day. If you can, add a third dose for lunch or at bedtime.  Stop Lantus.  Start insulin NPH (insulin N): Insulin Before breakfast Before lunch Before dinner  Regular 16 units - regular meal 20 units - larger meal 16 units - regular meal 20 units - larger meal 16 units - regular meal 20 units - larger meal  NPH 30 - 20  Please inject the insulin 30 min before meals.  Please mix the insulins in the same column in the same syringe.  Please return in 1.5 months with your sugar log.    - continue checking sugars at different times of the day - check 2-3 times a day, rotating checks - advised for yearly eye exams >> he is UTD - Return to clinic in 1.5 mo with sugar log>> check Hba1c then.

## 2014-10-09 ENCOUNTER — Other Ambulatory Visit: Payer: Self-pay | Admitting: *Deleted

## 2014-10-09 MED ORDER — GLUCOSE BLOOD VI STRP: ORAL_STRIP | Status: DC

## 2014-10-16 ENCOUNTER — Encounter: Payer: Self-pay | Admitting: Internal Medicine

## 2014-10-16 NOTE — Progress Notes (Signed)
Received labs from PCP (10/12/2014): - Hemoglobin A1c 9.6% (increased) - CMP showing: Glucose 259, BUN/creatinine 24/1.34, sodium 135, otherwise normal - TSH 0.78 - Lipids: 179/264/33/93

## 2014-11-13 ENCOUNTER — Ambulatory Visit (INDEPENDENT_AMBULATORY_CARE_PROVIDER_SITE_OTHER): Payer: Medicare Other | Admitting: Internal Medicine

## 2014-11-13 ENCOUNTER — Other Ambulatory Visit (INDEPENDENT_AMBULATORY_CARE_PROVIDER_SITE_OTHER): Payer: Medicare Other | Admitting: *Deleted

## 2014-11-13 ENCOUNTER — Encounter: Payer: Self-pay | Admitting: Internal Medicine

## 2014-11-13 VITALS — BP 134/76 | HR 77 | Temp 97.9°F | Resp 12 | Wt 313.0 lb

## 2014-11-13 DIAGNOSIS — E1165 Type 2 diabetes mellitus with hyperglycemia: Principal | ICD-10-CM

## 2014-11-13 DIAGNOSIS — Z23 Encounter for immunization: Secondary | ICD-10-CM

## 2014-11-13 DIAGNOSIS — E1140 Type 2 diabetes mellitus with diabetic neuropathy, unspecified: Secondary | ICD-10-CM | POA: Diagnosis not present

## 2014-11-13 DIAGNOSIS — E1142 Type 2 diabetes mellitus with diabetic polyneuropathy: Secondary | ICD-10-CM

## 2014-11-13 MED ORDER — HUMULIN N 100 UNIT/ML ~~LOC~~ SUSP: SUBCUTANEOUS | Status: DC

## 2014-11-13 MED ORDER — GLUCOSE BLOOD VI STRP
ORAL_STRIP | Status: DC
Start: 2014-11-13 — End: 2015-03-29

## 2014-11-13 MED ORDER — INSULIN REGULAR HUMAN 100 UNIT/ML IJ SOLN: INTRAMUSCULAR | Status: AC

## 2014-11-13 NOTE — Progress Notes (Signed)
Patient ID: MARKEZ DOWLAND, male   DOB: 28-Aug-1940, 74 y.o.   MRN: [id number]  HPI: Steven Gordon is a 74 y.o.-year-old male, returning for f/u for DM2, dx in 2000, insulin-dependent, uncontrolled, with complications (PN). Last visit 1.5 mo ago. He is in the donut hole.  Last hemoglobin A1c was: Received labs from PCP (10/12/2014): Hemoglobin A1c 9.6% Lab Results  Component Value Date   HGBA1C 7.9* 07/09/2014  02/20/2014: 9.7%  Pt was on a regimen of: - Metformin XR 1000 mg in am and 1000 mg in pm  - Lantus 25 units in am and 75 units in hs >> 61 units at bedtime >> 50 units at bedtime. - R insulin: - 8 units with a smaller meal - 12 units with a regular meal - 16 units with a larger meal  At this visit, we need to change from Lantus to NPH b/c donut hole: Insulin Before breakfast Before lunch Before dinner Bedtime  Regular 16 units - regular meal 20 units - larger meal 16 units - regular meal 20 units - larger meal 16 units - regular meal 20  units - larger meal   NPH 30 - 20 >> 30 20  We decreased Metformin XR to 500 mg bid 2/2 nausea.  Pt checks his sugars 2x a day and they are: - am: 130-140 >> 140-150 >> 169-284 - 2h after b'fast: n/c - before lunch: n/c >> 189-216 - 2h after lunch: n/c - before dinner: 180-190 >> 180-190 >> 128, 170-249 - 2h after dinner: n/c >> 178, 243-294 - bedtime: n/c >> 113, 188-396 - nighttime: n/c No lows. Lowest sugar was 70 >> 90s >> 128; he has hypoglycemia awareness at 70.  Highest sugar was 330 >> 460 >> 396  Glucometer: One Touch Ultra  Pt's meals are: - Breakfast: gravy biscuit ([employer]) - Lunch: meat and veggies - Dinner: stir fry or meat and veggies - Snacks: 1 at night - dessert 3x a week: vanilla wafers  - + mild CKD, last BUN/creatinine:  10/12/2014: Glucose 259, BUN/creatinine 24/1.34, sodium 135, otherwise normal Lab Results  Component Value Date   BUN 22* 07/09/2014   CREATININE 1.2 07/09/2014  On Lisinopril. -  last set of lipids: 10/12/2014: 179/264/33/93  Lab Results  Component Value Date   CHOL 177 07/09/2014   HDL 14* 07/09/2014   TRIG 475* 07/09/2014  On Omega 3 FA - last eye exam was in 04/2014. Dr Bing Plume. ? DR.  - no numbness and tingling in his feet. On high dose neurontin - 800 mg 3x a day.  ROS: Constitutional: no weight gain/loss, + fatigue, no subjective hyperthermia/hypothermia, + poor sleep Eyes: no blurry vision, no xerophthalmia ENT: no sore throat, no nodules palpated in throat, no dysphagia/odynophagia, no hoarseness, + decreased hearing Cardiovascular: no CP/+ SOB/no palpitations/+ leg swelling Respiratory: no cough/+ SOB Gastrointestinal: + N/no V/+ D/no C Musculoskeletal: no muscle/joint aches Skin: no rashes Neurological: + tremors/no numbness/ tingling/no dizziness, = HA  I reviewed pt's medications, allergies, PMH, social hx, family hx, and changes were documented in the history of present illness. Otherwise, unchanged from my initial visit note:  Past Medical History  Diagnosis Date  . Diabetes mellitus without complication     Type 2  . Obesity   . Sleep apnea   . Osteoarthritis   . Chronic pain   . Depression     Chronic depression  . Hyperlipidemia   . Myalgia   . Thyroid disease  hypothyroid; goiter  . Hypertension   . Diabetic neuropathy    No past surgical history on file. History   Social History Main Topics  . Smoking status: Never Smoker   . Smokeless tobacco: Not on file  . Alcohol Use: No  . 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 aspirin 325 MG EC tablet      1/2 tablet         . Cholecalciferol (VITAMIN D) 2000 UNITS CAPS      1 tablet with a meal         . fenofibrate 54 MG tablet      1 tablet with a meal         . gabapentin (NEURONTIN) 800  MG tablet   Oral   Take 800 mg by mouth 3 (three) times daily.       0   . Ginkgo Biloba 120 MG CAPS      1 capsule         . glucose blood (ONE TOUCH TEST STRIPS) test strip      Use as instructed 3x a day.Dx: E11.40   100 each   5     One Touch Ultra Test Strip   . HUMULIN N 100 UNIT/ML injection      Inject under skin 30 units before b'fast and 20 units before dinner - ReliOn   60 mL   1     Dispense as written.   Marland Kitchen HYDROcodone-acetaminophen (NORCO/VICODIN) 5-325 MG per tablet   Oral   Take 1 tablet by mouth every 8 (eight) hours as needed for moderate pain.         . Insulin Pen Needle 31G X 8 MM MISC   Does not apply   by Does not apply route.         . insulin regular (NOVOLIN R,HUMULIN R) 100 units/mL injection      INJECT 16-20 units under skin 30 min before the 3 meals   60 mL   1   . levothyroxine (SYNTHROID, LEVOTHROID) 200 MCG tablet            4   . lisinopril-hydrochlorothiazide (PRINZIDE,ZESTORETIC) 20-12.5 MG per tablet      TAKE (2) TABLETS DAILY.         Marland Kitchen MANGANESE PO      1 tablet         . metFORMIN (GLUCOPHAGE-XR) 500 MG 24 hr tablet   Oral   Take 1 tablet (500 mg total) by mouth daily with breakfast.   360 tablet   1   . Multiple Vitamins-Minerals (CENTRUM SILVER PO)   Oral   Take 1 tablet by mouth daily.         . Omega 3 1000 MG CAPS      1 capsule with a meal         . promethazine (PHENERGAN) 25 MG tablet   Oral   Take 25 mg by mouth every 6 (six) hours as needed for nausea or vomiting.          Allergies  Allergen Reactions  . Metformin Hcl Nausea Only  . Penicillins Rash   FH: - DM in mother  PE: BP 134/76 mmHg  Pulse 77  Temp(Src) 97.9 F (36.6 C) (Oral)  Resp 12  Wt 313 lb (141.976 kg)  SpO2 95% Body mass index is 42.44 kg/(m^2). Wt Readings from Last 3 Encounters:  11/13/14 313 lb (141.976 kg)  09/24/14 306 lb (138.801 kg)  07/20/14 303 lb (137.44 kg)   Constitutional:  overweight, in NAD Eyes: PERRLA, EOMI, no exophthalmos ENT: moist mucous membranes, no thyromegaly, surgical scar healed lower neck, no cervical lymphadenopathy Cardiovascular: RRR, No MRG Respiratory: CTA B Gastrointestinal: abdomen soft, NT, ND, BS+ Musculoskeletal: no deformities, strength intact in all 4 Skin: moist, warm, no rashes Neurological: no tremor with outstretched hands, DTR normal in all 4  ASSESSMENT: 1. DM2, insulin-dependent, uncontrolled, with complications - PN  PLAN:  1. Patient with long-standing, uncontrolled diabetes, on basal-bolus insulin regimen + metformin. Sugars now slightly better especially after he increased N insulin at night, but we need even higher doses as his sugars are still high. Since even Metformin XR gives him nausea even at decreased dose >> will stop completely. - I suggested to:  Patient Instructions   Please stop Metformin ER.  Start insulin NPH (insulin N): Insulin Before breakfast Before lunch Before dinner Bedtime  Regular 20 units - regular meal 25 units - larger meal 20 units - regular meal 25 units - larger meal 25 units - regular meal 30 units - larger meal 10 units if you have a snack with >15 g of carbs  NPH 40 - - 40  Please inject the insulin 30 min before meals.  Please return in 1.5 months with your sugar log.   - refilled his insulins and strips  - continue checking sugars at different times of the day - check 2-3 times a day, rotating checks - advised for yearly eye exams >> he is UTD - will give the flu shot today - Return to clinic in 1.5 mo with sugar log

## 2014-11-13 NOTE — Patient Instructions (Addendum)
Please stop Metformin ER.  Start insulin NPH (insulin N): Insulin Before breakfast Before lunch Before dinner Bedtime  Regular 20 units - regular meal 25 units - larger meal 20 units - regular meal 25 units - larger meal 25 units - regular meal 30 units - larger meal 10 units if you have a snack with >15 g of carbs  NPH 40 - - 40  Please inject the insulin 30 min before meals.  Please return in 1.5 months with your sugar log.

## 2014-12-25 ENCOUNTER — Ambulatory Visit (INDEPENDENT_AMBULATORY_CARE_PROVIDER_SITE_OTHER): Payer: Medicare Other | Admitting: Internal Medicine

## 2014-12-25 ENCOUNTER — Encounter: Payer: Self-pay | Admitting: Internal Medicine

## 2014-12-25 VITALS — BP 136/72 | HR 78 | Temp 98.2°F | Resp 12 | Wt 319.0 lb

## 2014-12-25 DIAGNOSIS — R51 Headache: Secondary | ICD-10-CM

## 2014-12-25 DIAGNOSIS — R519 Headache, unspecified: Secondary | ICD-10-CM

## 2014-12-25 DIAGNOSIS — E1142 Type 2 diabetes mellitus with diabetic polyneuropathy: Secondary | ICD-10-CM

## 2014-12-25 DIAGNOSIS — E1165 Type 2 diabetes mellitus with hyperglycemia: Secondary | ICD-10-CM

## 2014-12-25 DIAGNOSIS — R110 Nausea: Secondary | ICD-10-CM

## 2014-12-25 DIAGNOSIS — G8929 Other chronic pain: Secondary | ICD-10-CM

## 2014-12-25 MED ORDER — HUMULIN N 100 UNIT/ML ~~LOC~~ SUSP: SUBCUTANEOUS | Status: DC

## 2014-12-25 NOTE — Patient Instructions (Signed)
Please increase the insulin doses:  Insulin Before breakfast Before lunch Before dinner Bedtime  Regular 20 units - regular meal 25 units - larger meal 20 units - regular meal 25 units - larger meal 25 units - regular meal 30 units - larger meal 10 units if you have a snack with >15 g of carbs  NPH 40 - - 40 >> 50   Please return in 2 months with your sugar log.

## 2014-12-25 NOTE — Progress Notes (Signed)
Patient ID: Steven Gordon, male   DOB: [birth year], 74 y.o.   MRN: [id number]  HPI: Steven Gordon is a 74 y.o.-year-old male, returning for f/u for DM2, dx in 2000, insulin-dependent, uncontrolled, with complications (PN). Last visit 1.5 mo ago.  He c/o nausea, fatigue, HAs.   Last hemoglobin A1c was: Received labs from PCP (10/12/2014): Hemoglobin A1c 9.6% Lab Results  Component Value Date   HGBA1C 7.9* 07/09/2014  02/20/2014: 9.7%  We changed from Lantus to NPH b/c doughnut hole: Insulin Before breakfast Before lunch Before dinner Bedtime  Regular 20 units - regular meal 25 units - larger meal 20 units - regular meal 25 units - larger meal 25 units - regular meal 30 units - larger meal 10 units if you have a snack with >15 g of carbs  NPH 40 - - 40   We stopped Metformin >> nausea.  Pt checks his sugars 2x a day and they are: - am: 130-140 >> 140-150 >> 169-284 >> 124-244, 277, 358 - 2h after b'fast: n/c - before lunch: n/c >> 189-216 >>222 - 2h after lunch: n/c >> 143 - before dinner: 180-190 >> 180-190 >> 128, 170-249 >> 103-218 - 2h after dinner: n/c >> 178, 243-294 >> 236-271 - bedtime: n/c >> 113, 188-396 >> 169-206 - nighttime: n/c No lows. Lowest sugar was 70 >> 90s >> 128; he has hypoglycemia awareness at 70.  Highest sugar was 330 >> 460 >> 396 >> 358  Glucometer: One Touch Ultra  Pt's meals are: - Breakfast: gravy biscuit ([employer]) - Lunch: meat and veggies - Dinner: stir fry or meat and veggies - Snacks: 1 at night - dessert 3x a week: vanilla wafers  - + mild CKD, last BUN/creatinine:  10/12/2014: Glucose 259, BUN/creatinine 24/1.34, sodium 135, otherwise normal Lab Results  Component Value Date   BUN 22* 07/09/2014   CREATININE 1.2 07/09/2014  On Lisinopril. - last set of lipids: 10/12/2014: 179/264/33/93  Lab Results  Component Value Date   CHOL 177 07/09/2014   HDL 14* 07/09/2014   TRIG 475* 07/09/2014  On Omega 3 FA - last eye exam was in  04/2014. Dr Bing Plume. ? DR.  - no numbness and tingling in his feet. On high dose neurontin - 800 mg 3x a day.  ROS: Constitutional: no weight gain/loss, + fatigue, no subjective hyperthermia/hypothermia, + poor sleep Eyes: + blurry vision, no xerophthalmia ENT: no sore throat, no nodules palpated in throat, no dysphagia/odynophagia, no hoarseness, + decreased hearing Cardiovascular: no CP/SOB/no palpitations/+ leg swelling Respiratory: no cough/SOB Gastrointestinal: + N/no V/D/C Musculoskeletal: no muscle/joint aches Skin: + rash Neurological: + tremors/no numbness/ tingling/no dizziness, + HA + Problems with erections  I reviewed pt's medications, allergies, PMH, social hx, family hx, and changes were documented in the history of present illness. Otherwise, unchanged from my initial visit note:  Past Medical History  Diagnosis Date  . Diabetes mellitus without complication (HCC)     Type 2  . Obesity   . Sleep apnea   . Osteoarthritis   . Chronic pain   . Depression     Chronic depression  . Hyperlipidemia   . Myalgia   . Thyroid disease     hypothyroid; goiter  . Hypertension   . Diabetic neuropathy ([place])    No past surgical history on file. History   Social History Main Topics  . Smoking status: Never Smoker   . Smokeless tobacco: Not on file  . Alcohol Use: No  .  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 aspirin 325 MG EC tablet      1/2 tablet         . Cholecalciferol (VITAMIN D) 2000 UNITS CAPS      1 tablet with a meal         . fenofibrate 54 MG tablet      1 tablet with a meal         . gabapentin (NEURONTIN) 800 MG tablet   Oral   Take 800 mg by mouth 3 (three) times daily.       0   . Ginkgo Biloba 120 MG CAPS      1 capsule         . glucose blood (ONE TOUCH  TEST STRIPS) test strip      Use as instructed 3x a day.Dx: E11.40   100 each   5     One Touch Ultra Test Strip   . HUMULIN N 100 UNIT/ML injection      Inject under skin 40 units before b'fast and 40 units before dinner - ReliOn   60 mL   1     Dispense as written.   . Insulin Pen Needle 31G X 8 MM MISC   Does not apply   by Does not apply route.         . insulin regular (NOVOLIN R,HUMULIN R) 100 units/mL injection      INJECT 10-30 units under skin 30 min before the 3 meals   60 mL   1   . levothyroxine (SYNTHROID, LEVOTHROID) 200 MCG tablet            4   . lisinopril-hydrochlorothiazide (PRINZIDE,ZESTORETIC) 20-12.5 MG per tablet      TAKE (2) TABLETS DAILY.         Marland Kitchen MANGANESE PO      1 tablet         . metFORMIN (GLUCOPHAGE-XR) 500 MG 24 hr tablet   Oral   Take 1 tablet (500 mg total) by mouth daily with breakfast.   360 tablet   1   . Multiple Vitamins-Minerals (CENTRUM SILVER PO)   Oral   Take 1 tablet by mouth daily.         . Omega 3 1000 MG CAPS      1 capsule with a meal         . promethazine (PHENERGAN) 25 MG tablet   Oral   Take 25 mg by mouth every 6 (six) hours as needed for nausea or vomiting.          Allergies  Allergen Reactions  . Metformin Hcl Nausea Only  . Penicillins Rash   FH: - DM in mother  PE: BP 136/72 mmHg  Pulse 78  Temp(Src) 98.2 F (36.8 C) (Oral)  Resp 12  Wt 319 lb (144.697 kg)  SpO2 96% Body mass index is 43.25 kg/(m^2). Wt Readings from Last 3 Encounters:  12/25/14 319 lb (144.697 kg)  11/13/14 313 lb (141.976 kg)  09/24/14 306 lb (138.801 kg)   Constitutional: overweight, in NAD Eyes: PERRLA, EOMI, no exophthalmos ENT: moist mucous membranes, no thyromegaly, surgical scar healed lower neck, no cervical lymphadenopathy Cardiovascular: RRR, No MRG  Respiratory: CTA B Gastrointestinal: abdomen soft, NT, ND, BS+ Musculoskeletal: no deformities, strength intact in all 4 Skin: moist,  warm, no rashes Neurological: no tremor with outstretched hands, DTR normal in all 4  ASSESSMENT: 1. DM2, insulin-dependent, uncontrolled, with complications - PN  2. Headaches  3. Nausea  PLAN:  1. Patient with long-standing, uncontrolled diabetes, on basal-bolus insulin regimen and now off metformin (because of nausea). Sugars similar as before, as he is feeling poorly with headache, nausea, and fatigue. Sugars are fluctuating during the day, but they are high after dinner and in the morning, so we will increase his dinnertime regular insulin dose to 30 units and we'll also increase his bedtime NPH to 50 units. I advised him that we may need to back off the doses when he is feeling better. He tells me that he has days when he is feeling better and his sugars are also better during those days. - I suggested to:  Patient Instructions   Please increase the insulin doses:  Insulin Before breakfast Before lunch Before dinner Bedtime  Regular 20 units - regular meal 25 units - larger meal 20 units - regular meal 25 units - larger meal 25 units - regular meal 30 units - larger meal 10 units if you have a snack with >15 g of carbs  NPH 40 - - 40 >> 50   Please return in 2 months with your sugar log.   - continue checking sugars at different times of the day - check 2-3 times a day, rotating checks - advised for yearly eye exams >> he is UTD - had flu shot at last visit Return in about 2 months (around 02/24/2015).  2. Headaches - Patient takes 4000 mg of aspirin or equivalent NSAID every night. I believe that he could have developed medication overuse headaches. I suggested that he may benefit from seeing the headache clinic.  3. Nausea - This could be secondary to NSAID-induced gastritis. I strongly advised him to try to find an alternative to using high-dose NSAIDs every night. I also suggested that he may try a PPI (Prilosec OTC) for 2 weeks to see if the nausea improves.

## 2015-03-04 ENCOUNTER — Ambulatory Visit: Payer: Medicare Other | Admitting: Internal Medicine

## 2015-03-29 ENCOUNTER — Ambulatory Visit (INDEPENDENT_AMBULATORY_CARE_PROVIDER_SITE_OTHER): Payer: Medicare Other | Admitting: Internal Medicine

## 2015-03-29 ENCOUNTER — Encounter: Payer: Self-pay | Admitting: Internal Medicine

## 2015-03-29 VITALS — BP 114/74 | HR 76 | Temp 99.0°F | Ht 71.25 in | Wt 321.0 lb

## 2015-03-29 DIAGNOSIS — E1165 Type 2 diabetes mellitus with hyperglycemia: Secondary | ICD-10-CM | POA: Diagnosis not present

## 2015-03-29 DIAGNOSIS — R5382 Chronic fatigue, unspecified: Secondary | ICD-10-CM

## 2015-03-29 DIAGNOSIS — E1142 Type 2 diabetes mellitus with diabetic polyneuropathy: Secondary | ICD-10-CM | POA: Diagnosis not present

## 2015-03-29 MED ORDER — GLUCOSE BLOOD VI STRP: ORAL_STRIP | Status: AC

## 2015-03-29 NOTE — Patient Instructions (Signed)
Patient Instructions  Please continue the current insulin doses:  Insulin Before breakfast Before lunch Before dinner Bedtime  Regular 20 units - regular meal 25 units - larger meal 20 units - regular meal 25 units - larger meal 25 units - regular meal 30 units - larger meal 10 units if you have a snack with >15 g of carbs  NPH 40 - - 50   Please return in 3 months with your sugar log.

## 2015-03-29 NOTE — Progress Notes (Signed)
Patient ID: Steven Gordon, male   DOB: 04/03/1940, 75 y.o.   MRN: [id number]  HPI: Steven Gordon is a 75 y.o.-year-old male, returning for f/u for DM2, dx in 2000, insulin-dependent, uncontrolled, with complications (PN). Last visit 3 mo ago.  Nausea is better, but HAs are still present. Fatigue is very bothersome.  Last hemoglobin A1c was: 10/12/2014: HbA1c 9.6% Lab Results  Component Value Date   HGBA1C 7.9* 07/09/2014  02/20/2014: 9.7%  We changed from Lantus to NPH b/c doughnut hole:  Insulin Before breakfast Before lunch Before dinner Bedtime  Regular 20 units - regular meal 25 units - larger meal 20 units - regular meal 25 units - larger meal 25 units - regular meal 30 units - larger meal 10 units if you have a snack with >15 g of carbs  NPH 40 - - 40 >> 50  We stopped Metformin >> nausea.  Pt checks his sugars 2x a day and they are: - am: 130-140 >> 140-150 >> 169-284 >> 124-244, 277, 358 >> 130-140 - 2h after b'fast: n/c >> 70 (felt poorly) - before lunch: n/c >> 189-216 >>222 - 2h after lunch: n/c >> 143 - before dinner: 180-190 >> 180-190 >> 128, 170-249 >> 103-218 >> 180-190 - 2h after dinner: n/c >> 178, 243-294 >> 236-271 >> n/c - bedtime: n/c >> 113, 188-396 >> 169-206 >> <200 - nighttime: n/c No lows. Lowest sugar was 70 >> 90s >> 128 >> 70; he has hypoglycemia awareness at 70.  Highest sugar was 330 >> 460 >> 396 >> 358 >> 240 (seldom)  Glucometer: One Touch Ultra  Pt's meals are: - Breakfast: gravy biscuit ([employer]) - Lunch: meat and veggies - Dinner: stir fry or meat and veggies - Snacks: 1 at night - dessert 3x a week: vanilla wafers  - + mild CKD, last BUN/creatinine:  10/12/2014: Glucose 259, BUN/creatinine 24/1.34, sodium 135, otherwise normal Lab Results  Component Value Date   BUN 22* 07/09/2014   CREATININE 1.2 07/09/2014  On Lisinopril. - last set of lipids: 10/12/2014: 179/264/33/93  Lab Results  Component Value Date   CHOL 177  07/09/2014   HDL 14* 07/09/2014   TRIG 475* 07/09/2014  On Omega 3 FA - last eye exam was in 06/2014. Dr Bing Plume. ? DR.  - no numbness and tingling in his feet. On high dose neurontin - 800 mg 3x a day.  ROS: Constitutional: no weight gain/loss, + fatigue, no subjective hyperthermia/hypothermia Eyes: no blurry vision, no xerophthalmia ENT: no sore throat, no nodules palpated in throat, no dysphagia/odynophagia, no hoarseness, + decreased hearing Cardiovascular: no CP/SOB/no palpitations/+ leg swelling Respiratory: no cough/SOB Gastrointestinal: + N/no V/D/C Musculoskeletal: no muscle/joint aches Skin: + rash Neurological: + tremors/no numbness/ tingling/no dizziness, + HA + Problems with erections  I reviewed pt's medications, allergies, PMH, social hx, family hx, and changes were documented in the history of present illness. Otherwise, unchanged from my initial visit note:  Past Medical History  Diagnosis Date  . Diabetes mellitus without complication (HCC)     Type 2  . Obesity   . Sleep apnea   . Osteoarthritis   . Chronic pain   . Depression     Chronic depression  . Hyperlipidemia   . Myalgia   . Thyroid disease     hypothyroid; goiter  . Hypertension   . Diabetic neuropathy ([place])    No past surgical history on file. History   Social History Main Topics  . Smoking status:  Never Smoker   . Smokeless tobacco: Not on file  . Alcohol Use: No  . 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 aspirin 325 MG EC tablet      1/2 tablet         . Cholecalciferol (VITAMIN D) 2000 UNITS CAPS      1 tablet with a meal         . fenofibrate 54 MG tablet      1 tablet with a meal         . gabapentin (NEURONTIN) 800 MG tablet   Oral   Take 800 mg by mouth 3 (three) times daily.       0   .  Ginkgo Biloba 120 MG CAPS      1 capsule         . glucose blood (ONE TOUCH TEST STRIPS) test strip      Use as instructed 3x a day.Dx: E11.40   100 each   5     One Touch Ultra Test Strip   . HUMULIN N 100 UNIT/ML injection      Inject under skin 40 units before b'fast and 50 units before dinner - ReliOn   60 mL   1     Dispense as written.   . Insulin Pen Needle 31G X 8 MM MISC   Does not apply   by Does not apply route.         . insulin regular (NOVOLIN R,HUMULIN R) 100 units/mL injection      INJECT 10-30 units under skin 30 min before the 3 meals   60 mL   1   . levothyroxine (SYNTHROID, LEVOTHROID) 200 MCG tablet            4   . lisinopril-hydrochlorothiazide (PRINZIDE,ZESTORETIC) 20-12.5 MG per tablet      TAKE (2) TABLETS DAILY.         Marland Kitchen MANGANESE PO      1 tablet         . Multiple Vitamins-Minerals (CENTRUM SILVER PO)   Oral   Take 1 tablet by mouth daily.         . Omega 3 1000 MG CAPS      1 capsule with a meal         . promethazine (PHENERGAN) 25 MG tablet   Oral   Take 25 mg by mouth every 6 (six) hours as needed for nausea or vomiting.         . metFORMIN (GLUCOPHAGE-XR) 500 MG 24 hr tablet   Oral   Take 1 tablet (500 mg total) by mouth daily with breakfast. Patient not taking: Reported on 03/29/2015   360 tablet   1    Allergies  Allergen Reactions  . Metformin Hcl Nausea Only  . Penicillins Rash   FH: - DM in mother  PE: BP 114/74 mmHg  Pulse 76  Temp(Src) 99 F (37.2 C) (Oral)  Ht 5' 11.25" (1.81 m)  Wt 321 lb (145.605 kg)  BMI 44.44 kg/m2  SpO2 97% Body mass index is 44.44 kg/(m^2). Wt Readings from Last 3 Encounters:  03/29/15 321 lb (145.605 kg)  12/25/14 319 lb (144.697 kg)  11/13/14 313 lb (141.976  kg)   Constitutional: overweight, in NAD Eyes: PERRLA, EOMI, no exophthalmos ENT: moist mucous membranes, no thyromegaly, surgical scar healed lower neck, no cervical  lymphadenopathy Cardiovascular: RRR, No MRG Respiratory: CTA B Gastrointestinal: abdomen soft, NT, ND, BS+ Musculoskeletal: no deformities, strength intact in all 4 Skin: moist, warm, no rashes Neurological: no tremor with outstretched hands, DTR normal in all 4  ASSESSMENT: 1. 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 Fatigue  PLAN:  1. Patient with long-standing, uncontrolled diabetes, on basal-bolus insulin regimen and now off metformin (because of nausea). Sugars are much better after we increase the NPH at bedtime and especially after he stopped eating sweets at night (cheese cake, doughnuts)! They are not quite at goal, but much improved! Will not change regimen. - I suggested to:  Patient Instructions  Please continue the current insulin doses:  Insulin Before breakfast Before lunch Before dinner Bedtime  Regular 20 units - regular meal 25 units - larger meal 20 units - regular meal 25 units - larger meal 25 units - regular meal 30 units - larger meal 10 units if you have a snack with >15 g of carbs  NPH 40 - - 50   Please return in 3 months with your sugar log.   - continue checking sugars at different times of the day - check 2-3 times a day, rotating checks - advised for yearly eye exams >> he is UTD - HbA1c today >> 6.9% (great improvement!) - had flu shot this season  Return in about 3 months (around 06/26/2015).   2. Fatigue - he is taking Neurontin 800 mg 3x a day >> explained this can cause fatigue - I advised him to try to skip the am dose or try to shift the doses later in the day >> he will try - I also advised him that it is OK to take a nap in the pm or rest in his recliner at work

## 2015-03-29 NOTE — Progress Notes (Signed)
Pre visit review using our clinic review tool, if applicable. No additional management support is needed unless otherwise documented below in the visit note. 

## 2016-06-18 LAB — BASIC METABOLIC PANEL
BUN: 23 mg/dL — AB (ref 4–21)
CREATININE: 1.3 mg/dL (ref <=1.3)

## 2016-06-24 ENCOUNTER — Telehealth: Payer: Self-pay

## 2016-06-24 NOTE — Telephone Encounter (Signed)
Called patient and advised that we had received labs from [place]. I asked how his sugars have been doing recently, he stated that they have been up and down. I asked if he could give me his recent readings.   On 4/26 @2 :33- 84  On 4/28 @midnight - 277 @1 :20- 173  Patient then states that his meter battery is dying and it messed the dates up on his meter and he can not tell which date is correct for the rest of his readings.   Patient also stated that he changed the dose of the Novolin R to 5 units last week and he has not been having as many lows.

## 2016-06-25 NOTE — Telephone Encounter (Signed)
OK, thank you.

## 2016-07-17 ENCOUNTER — Encounter: Payer: Self-pay | Admitting: Cardiovascular Disease

## 2016-07-21 ENCOUNTER — Telehealth: Payer: Self-pay | Admitting: Cardiovascular Disease

## 2016-07-21 NOTE — Telephone Encounter (Signed)
Received records from [place] for appointment on 07/22/16 with Dr Gwenlyn Found.  Records put with Dr Kennon Holter schedule for 07/22/16. lp

## 2016-07-22 ENCOUNTER — Encounter: Payer: Self-pay | Admitting: Cardiovascular Disease

## 2016-07-22 ENCOUNTER — Ambulatory Visit (INDEPENDENT_AMBULATORY_CARE_PROVIDER_SITE_OTHER): Payer: Medicare Other | Admitting: Cardiovascular Disease

## 2016-07-22 VITALS — BP 148/66 | HR 81 | Ht 73.0 in | Wt 327.0 lb

## 2016-07-22 DIAGNOSIS — I10 Essential (primary) hypertension: Secondary | ICD-10-CM | POA: Diagnosis not present

## 2016-07-22 DIAGNOSIS — R0602 Shortness of breath: Secondary | ICD-10-CM

## 2016-07-22 DIAGNOSIS — E1142 Type 2 diabetes mellitus with diabetic polyneuropathy: Secondary | ICD-10-CM | POA: Diagnosis not present

## 2016-07-22 DIAGNOSIS — R0609 Other forms of dyspnea: Secondary | ICD-10-CM | POA: Diagnosis not present

## 2016-07-22 DIAGNOSIS — R0600 Dyspnea, unspecified: Secondary | ICD-10-CM | POA: Insufficient documentation

## 2016-07-22 LAB — BASIC METABOLIC PANEL
BUN/Creatinine Ratio: 19 (ref 10–24)
BUN: 25 mg/dL (ref 8–27)
CALCIUM: 9.4 mg/dL (ref 8.6–10.2)
CO2: 20 mmol/L (ref 18–29)
Chloride: 101 mmol/L (ref 96–106)
Creatinine, Ser: 1.3 mg/dL — ABNORMAL HIGH (ref 0.76–1.27)
GFR calc non Af Amer: 53 mL/min/{1.73_m2} — ABNORMAL LOW (ref >59)
GFR, EST AFRICAN AMERICAN: 61 mL/min/{1.73_m2} (ref >59)
Glucose: 198 mg/dL — ABNORMAL HIGH (ref 65–99)
POTASSIUM: 4.5 mmol/L (ref 3.5–5.2)
Sodium: 136 mmol/L (ref 134–144)

## 2016-07-22 NOTE — Patient Instructions (Addendum)
Medication Instructions: Your physician recommends that you continue on your current medications as directed. Please refer to the Current Medication list given to you today. If you need a refill on your cardiac medications before your next appointment, please call your pharmacy.  Labwork:  Your physician recommends that you return for lab work: BMET   Testing/Procedures: Your physician has requested that you have an echocardiogram. Echocardiography is a painless test that uses sound waves to create images of your heart. It provides your doctor with information about the size and shape of your heart and how well your heart's chambers and valves are working. This procedure takes approximately one hour. There are no restrictions for this procedure.  Your physician has requested that you have a lexiscan myoview. For further information please visit [email or web address]. Please follow instruction sheet, as given.  A chest x-ray takes a picture of the organs and structures inside the chest, including the heart, lungs, and blood vessels. This test can show several things, including, whether the heart is enlarges; whether fluid is building up in the lungs; and whether pacemaker / defibrillator leads are still in place.   Follow-Up: Your physician recommends that you schedule a follow-up appointment with Dr. Gwenlyn Found after testing.   Any Other Special Instructions will be listed below:   Pharmacologic Stress Electrocardiogram Introduction A pharmacologic stress electrocardiogram is a heart (cardiac) test that uses nuclear imaging to evaluate the blood supply to your heart. This test may also be called a pharmacologic stress electrocardiography. Pharmacologic means that a medicine is used to increase your heart rate and blood pressure. This stress test is done to find areas of poor blood flow to the heart by determining the extent of coronary artery disease (CAD). Some people exercise on a treadmill,  which naturally increases the blood flow to the heart. For those people unable to exercise on a treadmill, a medicine is used. This medicine stimulates your heart and will cause your heart to beat harder and more quickly, as if you were exercising. Pharmacologic stress tests can help determine:  The adequacy of blood flow to your heart during increased levels of activity in order to clear you for discharge home.  The extent of coronary artery blockage caused by CAD.  Your prognosis if you have suffered a heart attack.  The effectiveness of cardiac procedures done, such as an angioplasty, which can increase the circulation in your coronary arteries.  Causes of chest pain or pressure. LET [facility] CARE PROVIDER KNOW ABOUT:  Any allergies you have.  All medicines you are taking, including vitamins, herbs, eye drops, creams, and over-the-counter medicines.  Previous problems you or members of your family have had with the use of anesthetics.  Any blood disorders you have.  Previous surgeries you have had.  Medical conditions you have.  Possibility of pregnancy, if this applies.  If you are currently breastfeeding. RISKS AND COMPLICATIONS Generally, this is a safe procedure. However, as with any procedure, complications can occur. Possible complications include:  You develop pain or pressure in the following areas:  Chest.  Jaw or neck.  Between your shoulder blades.  Radiating down your left arm.  Headache.  Dizziness or light-headedness.  Shortness of breath.  Increased or irregular heartbeat.  Low blood pressure.  Nausea or vomiting.  Flushing.  Redness going up the arm and slight pain during injection of medicine.  Heart attack (rare). BEFORE THE PROCEDURE  Avoid all forms of caffeine for 24 hours before your test or  as directed by your health care provider. This includes coffee, tea (even decaffeinated tea), caffeinated sodas, chocolate, cocoa, and  certain pain medicines.  Follow your health care provider's instructions regarding eating and drinking before the test.  Take your medicines as directed at regular times with water unless instructed otherwise. Exceptions may include:  If you have diabetes, ask how you are to take your insulin or pills. It is common to adjust insulin dosing the morning of the test.  If you are taking beta-blocker medicines, it is important to talk to your health care provider about these medicines well before the date of your test. Taking beta-blocker medicines may interfere with the test. In some cases, these medicines need to be changed or stopped 24 hours or more before the test.  If you wear a nitroglycerin patch, it may need to be removed prior to the test. Ask your health care provider if the patch should be removed before the test.  If you use an inhaler for any breathing condition, bring it with you to the test.  If you are an outpatient, bring a snack so you can eat right after the stress phase of the test.  Do not smoke for 4 hours prior to the test or as directed by your health care provider.  Do not apply lotions, powders, creams, or oils on your chest prior to the test.  Wear comfortable shoes and clothing. Let your health care provider know if you were unable to complete or follow the preparations for your test. PROCEDURE  Multiple patches (electrodes) will be put on your chest. If needed, small areas of your chest may be shaved to get better contact with the electrodes. Once the electrodes are attached to your body, multiple wires will be attached to the electrodes, and your heart rate will be monitored.  An IV access will be started. A nuclear trace (isotope) is given. The isotope may be given intravenously, or it may be swallowed. Nuclear refers to several types of radioactive isotopes, and the nuclear isotope lights up the arteries so that the nuclear images are clear. The isotope is  absorbed by your body. This results in low radiation exposure.  A resting nuclear image is taken to show how your heart functions at rest.  A medicine is given through the IV access.  A second scan is done about 1 hour after the medicine injection and determines how your heart functions under stress.  During this stress phase, you will be connected to an electrocardiogram machine. Your blood pressure and oxygen levels will be monitored. What to expect after the procedure  Your heart rate and blood pressure will be monitored after the test.  You may return to your normal schedule, including diet,activities, and medicines, unless your health care provider tells you otherwise. This information is not intended to replace advice given to you by your health care provider. Make sure you discuss any questions you have with your health care provider. Document Released: 06/28/2008 Document Revised: 07/18/2015 Document Reviewed: 08/19/2015 Elsevier Interactive Patient Education  2017 [employer].

## 2016-07-22 NOTE — Assessment & Plan Note (Signed)
History of recent onset dyspnea on exertion over the last month. He denies chest pain. There is no history of tobacco abuse. There are no symptoms worrisome for an infectious etiology. We will get a 2-D echocardiogram and a pharmacologic Myoview stress test to rule out an ischemic etiology.

## 2016-07-22 NOTE — Progress Notes (Signed)
07/22/2016 Steven Gordon   [birth year]  [id number]  Primary Physician Leighton Ruff, MD Primary Cardiologist: Lorretta Harp MD [name]  HPI:  Steven Gordon is a very pleasant 76 year old severely overweight married Caucasian male with no children and works as an Administrator. He was referred by Dr. Drema Dallas for cardiovascular evaluation because of fairly new onset dyspnea on exertion. Exercise factors include diabetes and treated hypertension as well as family history. Brother had a myocardial infarction. He does have obstructive sleep apnea on CPAP. He has never had a heart attack or stroke. He denies chest pain. Last month he has noticed increasing dyspnea on exertion.   Current Outpatient Prescriptions  Medication Sig Dispense Refill  . aspirin 325 MG EC tablet 1/2 tablet    . Cholecalciferol (VITAMIN D) 2000 UNITS CAPS 1 tablet with a meal    . gabapentin (NEURONTIN) 800 MG tablet Take 800 mg by mouth 3 (three) times daily.   0  . Ginkgo Biloba 120 MG CAPS 1 capsule    . glucose blood (ONE TOUCH TEST STRIPS) test strip Use as instructed 3x a day.Dx: E11.40 200 each 5  . HUMULIN N 100 UNIT/ML injection Inject under skin 40 units before b'fast and 50 units before dinner - ReliOn 60 mL 1  . Insulin Pen Needle 31G X 8 MM MISC by Does not apply route.    . insulin regular (NOVOLIN R,HUMULIN R) 100 units/mL injection INJECT 10-30 units under skin 30 min before the 3 meals 60 mL 1  . levothyroxine (SYNTHROID, LEVOTHROID) 200 MCG tablet   4  . lisinopril-hydrochlorothiazide (PRINZIDE,ZESTORETIC) 20-12.5 MG per tablet TAKE (2) TABLETS DAILY.    Marland Kitchen MANGANESE PO 1 tablet    . Multiple Vitamins-Minerals (CENTRUM SILVER PO) Take 1 tablet by mouth daily.    . Omega 3 1000 MG CAPS 1 capsule with a meal     No current facility-administered medications for this visit.     Allergies  Allergen Reactions  . Metformin Hcl Nausea Only  . Penicillins Rash    Social  History   Social History  . Marital status: Married    Spouse name: N/A  . Number of children: N/A  . Years of education: N/A   Occupational History  . Not on file.   Social History Main Topics  . Smoking status: Never Smoker  . Smokeless tobacco: Never Used  . Alcohol use No  . Drug use: No  . Sexual activity: Not on file   Other Topics Concern  . Not on file   Social History Narrative   Armed forces logistics/support/administrative officer   Married   No children   Caffeine use-yes   No diet or exercise        Review of Systems: General: negative for chills, fever, night sweats or weight changes.  Cardiovascular: negative for chest pain, dyspnea on exertion, edema, orthopnea, palpitations, paroxysmal nocturnal dyspnea or shortness of breath Dermatological: negative for rash Respiratory: negative for cough or wheezing Urologic: negative for hematuria Abdominal: negative for nausea, vomiting, diarrhea, bright red blood per rectum, melena, or hematemesis Neurologic: negative for visual changes, syncope, or dizziness All other systems reviewed and are otherwise negative except as noted above.    Blood pressure (!) 148/66, pulse 81, height 6\' 1"  (1.854 m), weight (!) 327 lb (148.3 kg), SpO2 97 %.  General appearance: alert and no distress Neck: no adenopathy, no carotid bruit, no JVD, supple, symmetrical, trachea midline  and thyroid not enlarged, symmetric, no tenderness/mass/nodules Lungs: clear to auscultation bilaterally Heart: regular rate and rhythm, S1, S2 normal, no murmur, click, rub or gallop Extremities: extremities normal, atraumatic, no cyanosis or edema  EKG Not performed today, recent EKG performed by his PCP revealed sinus rhythm with left axis deviation and occasional PVCs.  ASSESSMENT AND PLAN:   Essential hypertension History of essential hypertension blood pressure measured at 142/76. He is on lisinopril and hydrochlorothiazide. Continue current meds At current  dosing.  Dyspnea on exertion History of recent onset dyspnea on exertion over the last month. He denies chest pain. There is no history of tobacco abuse. There are no symptoms worrisome for an infectious etiology. We will get a 2-D echocardiogram and a pharmacologic Myoview stress test to rule out an ischemic etiology.      Lorretta Harp MD FACP,FACC,FAHA, [facility] 07/22/2016 8:14 AM

## 2016-07-22 NOTE — Assessment & Plan Note (Addendum)
History of essential hypertension blood pressure measured at 142/76. He is on lisinopril and hydrochlorothiazide. Continue current meds At current dosing.

## 2016-07-23 ENCOUNTER — Telehealth (HOSPITAL_COMMUNITY): Payer: Self-pay

## 2016-07-23 NOTE — Telephone Encounter (Signed)
Encounter complete. 

## 2016-07-28 ENCOUNTER — Ambulatory Visit (HOSPITAL_COMMUNITY)
Admission: RE | Admit: 2016-07-28 | Discharge: 2016-07-28 | Disposition: A | Discharge status: Home / Self Care | Payer: Medicare Other | Source: Ambulatory Visit | Attending: Cardiovascular Disease | Admitting: Cardiovascular Disease

## 2016-07-28 DIAGNOSIS — E669 Obesity, unspecified: Secondary | ICD-10-CM | POA: Diagnosis not present

## 2016-07-28 DIAGNOSIS — Z6841 Body Mass Index (BMI) 40.0 and over, adult: Secondary | ICD-10-CM | POA: Diagnosis not present

## 2016-07-28 DIAGNOSIS — I10 Essential (primary) hypertension: Secondary | ICD-10-CM | POA: Insufficient documentation

## 2016-07-28 DIAGNOSIS — Z8249 Family history of ischemic heart disease and other diseases of the circulatory system: Secondary | ICD-10-CM | POA: Insufficient documentation

## 2016-07-28 DIAGNOSIS — E119 Type 2 diabetes mellitus without complications: Secondary | ICD-10-CM | POA: Insufficient documentation

## 2016-07-28 DIAGNOSIS — R42 Dizziness and giddiness: Secondary | ICD-10-CM | POA: Insufficient documentation

## 2016-07-28 DIAGNOSIS — R0602 Shortness of breath: Secondary | ICD-10-CM | POA: Diagnosis not present

## 2016-07-28 MED ORDER — TECHNETIUM TC 99M TETROFOSMIN IV KIT
31.0000 | PACK | Freq: Once | INTRAVENOUS | Status: AC | PRN
  Administered 2016-07-28: 31 via INTRAVENOUS
  Filled 2016-07-28: qty 31

## 2016-07-28 MED ORDER — REGADENOSON 0.4 MG/5ML IV SOLN
0.4000 mg | Freq: Once | INTRAVENOUS | Status: AC
  Administered 2016-07-28: 0.4 mg via INTRAVENOUS

## 2016-07-29 ENCOUNTER — Ambulatory Visit (HOSPITAL_COMMUNITY)
Admission: RE | Admit: 2016-07-29 | Discharge: 2016-07-29 | Disposition: A | Discharge status: Home / Self Care | Payer: Medicare Other | Source: Ambulatory Visit | Attending: Cardiology | Admitting: Cardiology

## 2016-07-29 LAB — MYOCARDIAL PERFUSION IMAGING
CHL CUP NUCLEAR SDS: 4
LV sys vol: 103 mL
Peak HR: 85 {beats}/min
Rest HR: 65 {beats}/min
SRS: 2
SSS: 6
TID: 1.04

## 2016-07-29 MED ORDER — TECHNETIUM TC 99M TETROFOSMIN IV KIT
29.2000 | PACK | Freq: Once | INTRAVENOUS | Status: AC | PRN
  Administered 2016-07-29: 29.2 via INTRAVENOUS

## 2016-07-30 ENCOUNTER — Encounter: Payer: Self-pay | Admitting: *Deleted

## 2016-07-30 NOTE — Telephone Encounter (Signed)
This encounter was created in error - please disregard.

## 2016-08-04 ENCOUNTER — Ambulatory Visit (HOSPITAL_COMMUNITY): Payer: Medicare Other | Attending: Cardiology

## 2016-08-04 ENCOUNTER — Ambulatory Visit
Admission: RE | Admit: 2016-08-04 | Discharge: 2016-08-04 | Disposition: A | Discharge status: Home / Self Care | Payer: Medicare Other | Source: Ambulatory Visit | Attending: Cardiovascular Disease | Admitting: Cardiovascular Disease

## 2016-08-04 ENCOUNTER — Other Ambulatory Visit: Payer: Self-pay

## 2016-08-04 DIAGNOSIS — R0602 Shortness of breath: Secondary | ICD-10-CM | POA: Diagnosis not present

## 2016-08-04 DIAGNOSIS — I5030 Unspecified diastolic (congestive) heart failure: Secondary | ICD-10-CM | POA: Insufficient documentation

## 2016-08-04 DIAGNOSIS — R0609 Other forms of dyspnea: Principal | ICD-10-CM

## 2016-08-04 DIAGNOSIS — I071 Rheumatic tricuspid insufficiency: Secondary | ICD-10-CM | POA: Diagnosis not present

## 2016-08-04 DIAGNOSIS — R0600 Dyspnea, unspecified: Secondary | ICD-10-CM

## 2016-08-07 ENCOUNTER — Encounter: Payer: Self-pay | Admitting: Cardiovascular Disease

## 2016-08-07 ENCOUNTER — Ambulatory Visit (INDEPENDENT_AMBULATORY_CARE_PROVIDER_SITE_OTHER): Payer: Medicare Other | Admitting: Cardiovascular Disease

## 2016-08-07 DIAGNOSIS — R0609 Other forms of dyspnea: Secondary | ICD-10-CM | POA: Diagnosis not present

## 2016-08-07 DIAGNOSIS — R0600 Dyspnea, unspecified: Secondary | ICD-10-CM

## 2016-08-07 NOTE — Progress Notes (Signed)
Mr. Fouse returns today for follow-up of his outpatient diagnostic tests performed in evaluation of new onset dyspnea on exertion. His 2-D echo was normal. His Myoview stress test showed normal perfusion. Results was no invasive studies do not suggest a cardiovascular cause of his dyspnea. I have reassured him and I will see him back in 6 months for follow-up.

## 2016-08-07 NOTE — Patient Instructions (Signed)

## 2016-08-07 NOTE — Assessment & Plan Note (Signed)
Steven Gordon returns today for follow-up of his outpatient diagnostic tests performed in evaluation of new onset dyspnea on exertion. His 2-D echo was normal. His Myoview stress test showed normal perfusion. Results was no invasive studies do not suggest a cardiovascular cause of his dyspnea. I have reassured him and I will see him back in 6 months for follow-up.

## 2016-09-01 ENCOUNTER — Ambulatory Visit (INDEPENDENT_AMBULATORY_CARE_PROVIDER_SITE_OTHER): Payer: Medicare Other | Admitting: Internal Medicine

## 2016-09-01 ENCOUNTER — Encounter: Payer: Self-pay | Admitting: Internal Medicine

## 2016-09-01 VITALS — BP 132/80 | HR 86 | Wt 329.0 lb

## 2016-09-01 DIAGNOSIS — E1142 Type 2 diabetes mellitus with diabetic polyneuropathy: Secondary | ICD-10-CM | POA: Diagnosis not present

## 2016-09-01 DIAGNOSIS — E1165 Type 2 diabetes mellitus with hyperglycemia: Secondary | ICD-10-CM

## 2016-09-01 LAB — POCT GLYCOSYLATED HEMOGLOBIN (HGB A1C): HEMOGLOBIN A1C: 6.6

## 2016-09-01 NOTE — Progress Notes (Signed)
Patient ID: Steven Gordon, male   DOB: 12/31/1940, 76 y.o.   MRN: [id number]  HPI: Steven Gordon is a 76 y.o.-year-old male, returning for f/u for DM2, dx in 2000, insulin-dependent, uncontrolled, with complications (PN). Last visit 1 year and 5 mo ago!  He is still SOB and fatigued. "I was found to have fluid in my lungs". He will see a pulmonologist soon.  Last hemoglobin A1c was: 04/03/2016: HbA1c 7.2% 07/18/2015: HbA1c 6.7% 10/12/2014: HbA1c 9.6% Lab Results  Component Value Date   HGBA1C 7.9 (A) 07/09/2014  02/20/2014: 9.7%  We had to use N and R insulin b/c $.She changed the doses of R insulin since last visit - Takes less with meals and more with a bedtime snack  (peanut butter sandwich).  Insulin Before breakfast Before lunch Before dinner Bedtime  Regular 15 units 20 units 20 units 20-25 units if he has a snack with >15 g of carbs  Novolin NPH 40 - - 50  We stopped Metformin b/c nausea.  Pt checks his sugars 1x a day: - am: 124-244, 277, 358 >> 130-140 >> n/c - 2h after b'fast: n/c >> 70 (felt poorly) >> n/c - before lunch: n/c >> 189-216 >>222 >> 66, 84-135, 249 - 2h after lunch: n/c >> 143 >> 184, 189, 219 - before dinner:  128, 170-249 >> 103-218 >> 180-190 >> 127, 235, 270 - 2h after dinner: n/c >> 178, 243-294 >> 236-271 >> n/c >> 66-279 - bedtime: n/c >> 113, 188-396 >> 169-206 >> <200 >> 139-235 - nighttime: n/c >> 67 No lows. Lowest sugar was 70 >> 90s >> 128 >> 70 >> 66; he has hypoglycemia awareness at 100.  Highest sugar was 330 >> 460 >> 396 >> 358 >> 240 (seldom) >> 270 (watermelon)  Glucometer: One Touch Ultra  Pt's meals are: - Breakfast: gravy biscuit ([employer]) - Lunch: meat and veggies - Dinner: stir fry or meat and veggies - Snacks: 1 at night - dessert 3x a week: vanilla wafers  - + mild CKD, last BUN/creatinine:  Lab Results  Component Value Date   BUN 25 07/22/2016   CREATININE 1.30 (H) 07/22/2016  10/12/2014: Glucose 259, BUN/creatinine  24/1.34, sodium 135, otherwise normal On Lisinopril. - last set of lipids: 04/03/2016: 192/335/34/91 10/12/2014: 179/264/33/93  Lab Results  Component Value Date   CHOL 177 07/09/2014   HDL 14 (A) 07/09/2014   TRIG 475 (A) 07/09/2014  On omega 3 FAs. - last eye exam was in 04/2016. Dr Bing Plume. No DR reportedly. - denies numbness and tingling in his feet. On high dose neurontin - 800 mg 3x a day.  ROS: Constitutional: no weight gain/no weight loss, +fatigue, no subjective hyperthermia, no subjective hypothermia Eyes: no blurry vision, no xerophthalmia ENT: no sore throat, no nodules palpated in throat, no dysphagia, no odynophagia, no hoarseness Cardiovascular: no CP/+SOB/no palpitations/no leg swelling Respiratory: no cough/+SOB/no wheezing Gastrointestinal: no N/no V/no D/no C/no acid reflux Musculoskeletal: no muscle aches/+int aches Skin: no rashes, no hair loss Neurological: no tremors/no numbness/no tingling/no dizziness  I reviewed pt's medications, allergies, PMH, social hx, family hx, and changes were documented in the history of present illness. Otherwise, unchanged from my initial visit note.  No past surgical history on file.   History   Social History Main Topics  . Smoking status: Never Smoker   . Smokeless tobacco: Not on file  . Alcohol Use: No  . Drug Use: No   Social History Therapist, nutritional  and designer   Married   No children   Caffeine use-yes   No diet or exercise       Current Outpatient Prescriptions  Medication Sig Dispense Refill  . aspirin 325 MG EC tablet 1/2 tablet    . Cholecalciferol (VITAMIN D) 2000 UNITS CAPS 1 tablet with a meal    . furosemide (LASIX) 20 MG tablet Take 20 mg by mouth daily.    Marland Kitchen gabapentin (NEURONTIN) 800 MG tablet Take 800 mg by mouth 3 (three) times daily.   0  . Ginkgo Biloba 120 MG CAPS 1 capsule    . glucose blood (ONE TOUCH TEST STRIPS) test strip Use as instructed 3x a day.Dx: E11.40 200 each  5  . HUMULIN N 100 UNIT/ML injection Inject under skin 40 units before b'fast and 50 units before dinner - ReliOn 60 mL 1  . Insulin Pen Needle 31G X 8 MM MISC by Does not apply route.    . insulin regular (NOVOLIN R,HUMULIN R) 100 units/mL injection INJECT 10-30 units under skin 30 min before the 3 meals 60 mL 1  . levothyroxine (SYNTHROID, LEVOTHROID) 200 MCG tablet   4  . lisinopril-hydrochlorothiazide (PRINZIDE,ZESTORETIC) 20-12.5 MG per tablet TAKE (2) TABLETS DAILY.    Marland Kitchen MANGANESE PO 1 tablet    . Multiple Vitamins-Minerals (CENTRUM SILVER PO) Take 1 tablet by mouth daily.    . Omega 3 1000 MG CAPS 1 capsule with a meal     No current facility-administered medications for this visit.     Allergies  Allergen Reactions  . Metformin Hcl Nausea Only  . Penicillins Rash   FH: - DM in mother  PE: BP 132/80 (BP Location: Left Arm, Patient Position: Sitting)   Pulse 86   Wt (!) 329 lb (149.2 kg)   SpO2 96%   BMI 42.24 kg/m  Body mass index is 42.24 kg/m. Wt Readings from Last 3 Encounters:  09/01/16 (!) 329 lb (149.2 kg)  08/07/16 (!) 327 lb 12.8 oz (148.7 kg)  07/28/16 (!) 327 lb (148.3 kg)   Constitutional: overweight, in NAD Eyes: PERRLA, EOMI, no exophthalmos ENT: moist mucous membranes, no thyromegaly, no cervical lymphadenopathy Cardiovascular: RRR, No MRG Respiratory: CTA B Gastrointestinal: abdomen soft, NT, ND, BS+ Musculoskeletal: no deformities, strength intact in all 4 Skin: moist, warm, no rashes Neurological: no tremor with outstretched hands, DTR normal in all 4  ASSESSMENT: 1. 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 Patient with long-standing, uncontrolled diabetes, On basal-bolus insulin regimen, returning back to long absence. His sugars are fluctuating, worse after he decreased his insulin with meals and increase the insulin with a snack. - I suggested to increase regular insulin dose with lunch and  dinner and decrease the dose with a nighttime snack - I suggested to:  Patient Instructions   Please change the insulin doses as follows:  Insulin Before breakfast Before lunch Before dinner Bedtime  Regular 15 units 25-28 units 25-28 units 10 units if you have a snack with >15 g of carbs  Novolin NPH 40 - - 50   Please return in 3 months with your sugar log.   - today, HbA1c is 6.6% (great!) - continue checking sugars at different times of the day - check 1x a day, rotating checks - advised for yearly eye exams >> he is UTD - Return to clinic in 3 mo with sugar log   Philemon Kingdom, MD PhD [facility] Endocrinology

## 2016-09-01 NOTE — Addendum Note (Signed)
Addended by: Caprice Beaver T on: 09/01/2016 03:13 PM   Modules accepted: Orders

## 2016-09-01 NOTE — Patient Instructions (Addendum)
Please change the insulin doses as follows:  Insulin Before breakfast Before lunch Before dinner Bedtime  Regular 15 units 25-28 units 25-28 units 10 units if you have a snack with >15 g of carbs  Novolin NPH 40 - - 50   Please return in 3 months with your sugar log.

## 2016-09-23 ENCOUNTER — Telehealth: Payer: Self-pay | Admitting: Internal Medicine

## 2016-09-23 NOTE — Telephone Encounter (Signed)
Patient's wife called in reference to patient getting treated for testosterone. Patient has a appointment scheduled in October but wants to know if he can get in sooner for this issue. Patient saw Dr. Scherrie November and his testosterone was 201.   Please call patient and advise. OK to leave message.

## 2016-09-23 NOTE — Telephone Encounter (Signed)
Please advise on if you want to see him earlier?? Thank you!

## 2016-09-24 ENCOUNTER — Telehealth: Payer: Self-pay

## 2016-09-24 NOTE — Telephone Encounter (Signed)
This is not an emergency/urgency. The testosterone is not unexpected at his age. I would suggest to see urology b/c at this age, prostate hypertrophy may be a contraindication to testosterone tx. They can also manage his low T.

## 2016-09-24 NOTE — Telephone Encounter (Signed)
Patient called back and advised of the message. No questions at this time.

## 2016-09-24 NOTE — Telephone Encounter (Signed)
Attempted to contact patient to advise of the message from MD. No voicemail was set up to leave message so will try again later.

## 2016-12-03 ENCOUNTER — Encounter: Payer: Self-pay | Admitting: Internal Medicine

## 2016-12-03 ENCOUNTER — Ambulatory Visit (INDEPENDENT_AMBULATORY_CARE_PROVIDER_SITE_OTHER): Payer: Medicare Other | Admitting: Internal Medicine

## 2016-12-03 VITALS — BP 130/80 | HR 87 | Wt 328.0 lb

## 2016-12-03 DIAGNOSIS — E1165 Type 2 diabetes mellitus with hyperglycemia: Secondary | ICD-10-CM | POA: Diagnosis not present

## 2016-12-03 DIAGNOSIS — Z23 Encounter for immunization: Secondary | ICD-10-CM | POA: Diagnosis not present

## 2016-12-03 DIAGNOSIS — E785 Hyperlipidemia, unspecified: Secondary | ICD-10-CM | POA: Diagnosis not present

## 2016-12-03 DIAGNOSIS — E1142 Type 2 diabetes mellitus with diabetic polyneuropathy: Secondary | ICD-10-CM | POA: Diagnosis not present

## 2016-12-03 LAB — POCT GLYCOSYLATED HEMOGLOBIN (HGB A1C): HEMOGLOBIN A1C: 6.3

## 2016-12-03 NOTE — Progress Notes (Signed)
Patient ID: Steven Gordon, male   DOB: 05/31/1940, 76 y.o.   MRN: [id number]  HPI: Steven Gordon is a 76 y.o.-year-old male, returning for f/u for DM2, dx in 2000, insulin-dependent, uncontrolled, with complications (PN, CKD). Last visit 3 mo ago.  Last hemoglobin A1c was: Lab Results  Component Value Date   HGBA1C 6.6 09/01/2016   HGBA1C 7.9 (A) 07/09/2014  04/03/2016: HbA1c 7.2% 07/18/2015: HbA1c 6.7% 10/12/2014: HbA1c 9.6% 02/20/2014: 9.7%  We had to use N and R insulin b/c $.She changed the doses of R insulin since last visit - Takes less with meals and more with a bedtime snack  (peanut butter sandwich).  Insulin Before breakfast Before lunch Before dinner Bedtime  Regular 15 units 25-28 units >> 15 units 25-28 units >> 15 units 10 units if you have a snack with >15 g of carbs - he is having this even if he has no carbs with his snack!  Novolin NPH 40 - - 50   We stopped Metformin b/c nausea.  Pt checks his sugars 1x a day: - am: 124-244, 277, 358 >> 130-140 >> n/c >> 52, 53, 100-165, 204, 273 - 2h after b'fast: n/c >> 70 (felt poorly) >> n/c - before lunch: 189-216 >>222 >> 66, 84-135, 249 >> 56, 60-163 - 2h after lunch: n/c >> 143 >> 184, 189, 219 >> 61, 229 - before dinner: 180-190 >> 127, 235, 270 >> 85-195, 206, 249 (snack) - 2h after dinner:  236-271 >> n/c >> 66-279 >> n/c - bedtime:  169-206 >> <200 >> 139-235 >> 107, 174-274, 337 - nighttime: n/c >> 67 Lowest sugar was 66 >> 52; he has hypoglycemia awareness at 100 Highest sugar was 270 (watermelon) >> 337  Glucometer: One Touch Ultra  Pt's meals are: - Breakfast: gravy biscuit ([employer]) - Lunch: meat and veggies - Dinner: stir fry or meat and veggies - Snacks: 1 at night - dessert 3x a week: vanilla wafers  - + mild CKD, last BUN/creatinine:  Lab Results  Component Value Date   BUN 25 07/22/2016   CREATININE 1.30 (H) 07/22/2016  10/12/2014: Glucose 259, BUN/creatinine 24/1.34, sodium 135, otherwise  normal On Lisinopril. - last set of lipids: 04/03/2016: 192/335/34/91 10/12/2014: 179/264/33/93  Lab Results  Component Value Date   CHOL 177 07/09/2014   HDL 14 (A) 07/09/2014   TRIG 475 (A) 07/09/2014  On omega 3 FAs - last eye exam was in 04/2016 >> No DR. Dr Bing Plume.  - He denies numbness and tingling in his feet. On high dose neurontin - 800 mg 3x a day.  ROS: Constitutional: no weight gain/no weight loss, no fatigue, no subjective hyperthermia, no subjective hypothermia Eyes: no blurry vision, no xerophthalmia ENT: no sore throat, no nodules palpated in throat, no dysphagia, no odynophagia, no hoarseness Cardiovascular: no CP+ SOB/no palpitations/no leg swelling Respiratory: no cough/+ SOB/no wheezing Gastrointestinal: no N/no V/no D/no C/no acid reflux Musculoskeletal: no muscle aches/no joint aches Skin: no rashes, no hair loss Neurological: no tremors/no numbness/no tingling/no dizziness  I reviewed pt's medications, allergies, PMH, social hx, family hx, and changes were documented in the history of present illness. Otherwise, unchanged from my initial visit note.  No past surgical history on file.   History   Social History Main Topics  . Smoking status: Never Smoker   . Smokeless tobacco: Not on file  . Alcohol Use: No  . Drug Use: No   Social History Camera operator  Married   No children   Caffeine use-yes   No diet or exercise   Current Outpatient Prescriptions  Medication Sig Dispense Refill  . aspirin 325 MG EC tablet 1/2 tablet    . Cholecalciferol (VITAMIN D) 2000 UNITS CAPS 1 tablet with a meal    . gabapentin (NEURONTIN) 800 MG tablet Take 800 mg by mouth 3 (three) times daily.   0  . Ginkgo Biloba 120 MG CAPS 1 capsule    . glucose blood (ONE TOUCH TEST STRIPS) test strip Use as instructed 3x a day.Dx: E11.40 200 each 5  . HUMULIN N 100 UNIT/ML injection Inject under skin 40 units before b'fast and 50 units before  dinner - ReliOn 60 mL 1  . Insulin Pen Needle 31G X 8 MM MISC by Does not apply route.    . insulin regular (NOVOLIN R,HUMULIN R) 100 units/mL injection INJECT 10-30 units under skin 30 min before the 3 meals 60 mL 1  . levothyroxine (SYNTHROID, LEVOTHROID) 200 MCG tablet   4  . lisinopril-hydrochlorothiazide (PRINZIDE,ZESTORETIC) 20-12.5 MG per tablet TAKE (2) TABLETS DAILY.    Marland Kitchen MANGANESE PO 1 tablet    . Multiple Vitamins-Minerals (CENTRUM SILVER PO) Take 1 tablet by mouth daily.    . Omega 3 1000 MG CAPS 1 capsule with a meal     No current facility-administered medications for this visit.     Allergies  Allergen Reactions  . Metformin Hcl Nausea Only  . Penicillins Rash   FH: - DM in mother  PE: BP 130/80 (BP Location: Left Arm, Patient Position: Sitting)   Pulse 87   Wt (!) 328 lb (148.8 kg)   SpO2 95%   BMI 42.11 kg/m  Body mass index is 42.11 kg/m. Wt Readings from Last 3 Encounters:  12/03/16 (!) 328 lb (148.8 kg)  09/01/16 (!) 329 lb (149.2 kg)  08/07/16 (!) 327 lb 12.8 oz (148.7 kg)   Constitutional: obese, in NAD Eyes: PERRLA, EOMI, no exophthalmos ENT: moist mucous membranes, no thyromegaly, no cervical lymphadenopathy Cardiovascular: RRR, No MRG Respiratory: CTA B Gastrointestinal: abdomen soft, NT, ND, BS+ Musculoskeletal: no deformities, strength intact in all 4 Skin: moist, warm, no rashes Neurological: no tremor with outstretched hands, DTR normal in all 4  ASSESSMENT: 1. 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 HL  PLAN:  1. Patient with long-standing, Uncontrolled diabetes, on basal-bolus insulin regimen, with better control in the last few months, however, also with low blood sugars occasionally at night around 3 AM. Upon questioning, he is taking his insulin with a snack at night even if he does not consume carbs, so at this visit, we discussed about only bolusing insulin for the snack if he  uses more than about 15 g of carbs. I will also decrease the amount of insulin that he takes in that situation. - Since last visit, he decreased the amount of insulin with lunch and dinner, but his sugars at bedtime are higher, so today I advised him to increase the insulin with dinner.   -  I did advise him to decrease the dose of NPH at night if his sugars still drop in the middle of the night.  - I suggested to:  Patient Instructions   Please change: Insulin Before breakfast Before lunch Before dinner Bedtime  Regular 15 units 15 units 20 units 6-8 units if you have a snack with >15 g of carbs  Novolin NPH 40 - -  50 (may need to decrease to 45 units if sugars still low at night)   Please return in 3 months with your sugar log.   - today, HbA1c is 6.3% (better) - continue checking sugars at different times of the day - check 3x a day, rotating checks - advised for yearly eye exams >> he is UTD - Return to clinic in 3 mo with sugar log   2. HL - elevated TG per review of latest Lipid panel from 03/2016 - on Omega 3 FAs 1000 mg 2x a day  Philemon Kingdom, MD PhD [facility] Endocrinology

## 2016-12-03 NOTE — Patient Instructions (Addendum)
Please change: Insulin Before breakfast Before lunch Before dinner Bedtime  Regular 15 units 15 units 20 units 6-8 units if you have a snack with >15 g of carbs  Novolin NPH 40 - - 50 (may need to decrease to 45 units if sugars still low at night)   Please return in 3 months with your sugar log.

## 2017-03-04 ENCOUNTER — Ambulatory Visit: Payer: Medicare Other | Admitting: Internal Medicine

## 2017-03-04 ENCOUNTER — Encounter: Payer: Self-pay | Admitting: Internal Medicine

## 2017-03-04 VITALS — BP 132/80 | HR 67 | Ht 74.0 in | Wt 325.4 lb

## 2017-03-04 DIAGNOSIS — E785 Hyperlipidemia, unspecified: Secondary | ICD-10-CM | POA: Diagnosis not present

## 2017-03-04 DIAGNOSIS — E1142 Type 2 diabetes mellitus with diabetic polyneuropathy: Secondary | ICD-10-CM

## 2017-03-04 DIAGNOSIS — E1165 Type 2 diabetes mellitus with hyperglycemia: Secondary | ICD-10-CM

## 2017-03-04 LAB — POCT GLYCOSYLATED HEMOGLOBIN (HGB A1C): HEMOGLOBIN A1C: 7.1

## 2017-03-04 NOTE — Addendum Note (Signed)
Addended by: Drucilla Schmidt on: 03/04/2017 04:30 PM   Modules accepted: Orders

## 2017-03-04 NOTE — Patient Instructions (Addendum)
Please continue:  Insulin Before breakfast Before lunch Before dinner Bedtime  Regular 15-20 units 15-20 units 20-25 units 6-8 units if you have a snack with >15 g of carbs  Novolin NPH 40 - - 50    Please return in 3 months with your sugar log.

## 2017-03-04 NOTE — Progress Notes (Signed)
Patient ID: Steven Gordon, male   DOB: [birth year], 77 y.o.   MRN: [id number]  HPI: Steven Gordon is a 77 y.o.-year-old male, returning for f/u for DM2, dx in 2000, insulin-dependent, uncontrolled, with complications (PN, CKD). Last visit 3 mo ago.  His wife fell and has been in an assisted living facility for 1 month.  She is home now, wheelchair-bound.  Last hemoglobin A1c was: Lab Results  Component Value Date   HGBA1C 6.3 12/03/2016   HGBA1C 6.6 09/01/2016   HGBA1C 7.9 (A) 07/09/2014  04/03/2016: HbA1c 7.2% 07/18/2015: HbA1c 6.7% 10/12/2014: HbA1c 9.6% 02/20/2014: 9.7%  We had to use N and R insulin b/c $:  Insulin Before breakfast Before lunch Before dinner Bedtime  Regular 15-20 units 15-20 units 20-25 units 6-8 units if you have a snack with >15 g of carbs  Novolin NPH 40 - - 50    We stopped Metformin b/c nausea.  Pt checks his sugars once a day: - am: 124-244, 277, 358 >> 130-140 >> n/c >> 52, 53, 100-165, 204, 273 >> 67, 85, 100-189, 220, 276 (when 200s >> missed insulin) - 2h after b'fast: n/c >> 70 (felt poorly) >> n/c >> 68 - before lunch: 189-216 >>222 >> 66, 84-135, 249 >> 56, 60-163 >>  76-215, 232 - 2h after lunch: n/c >> 143 >> 184, 189, 219 >> 61, 229 >> n/c - before dinner: 180-190 >> 127, 235, 270 >> 85-195, 206, 249 (snack) >> 73-215, 245, 357 - 2h after dinner:  236-271 >> n/c >> 66-279 >> n/c - bedtime:  169-206 >> <200 >> 139-235 >> 107, 174-274, 337 >> 121-322 - nighttime: n/c >> 67 >> n/c Lowest sugar was 66 >> 52 >> 67; he has hypoglycemia awareness at 70s. Highest sugar was 270 (watermelon) >> 337 >> 357  Glucometer: One Touch Ultra  Pt's meals are: - Breakfast: gravy biscuit ([employer]) - Lunch: meat and veggies - Dinner: stir fry or meat and veggies - Snacks: 1 at night - dessert 3x a week: vanilla wafers  -+ Mild CKD, last BUN/creatinine:  Lab Results  Component Value Date   BUN 25 07/22/2016   CREATININE 1.30 (H) 07/22/2016   10/12/2014: Glucose 259, BUN/creatinine 24/1.34, sodium 135, otherwise normal On lisinopril. -+ HL; last set of lipids: 04/03/2016: 192/335/34/91 10/12/2014: 179/264/33/93  Lab Results  Component Value Date   CHOL 177 07/09/2014   HDL 14 (A) 07/09/2014   TRIG 475 (A) 07/09/2014  On  omega-3 fatty acids - last eye exam was in 04/2016: No DR. Dr Bing Plume.  - He denies numbness and tingling in his feet.  On Neurontin 800 mg 3 times a day  ROS: Constitutional: no weight gain/no weight loss, + fatigue, no subjective hyperthermia, no subjective hypothermia Eyes: no blurry vision, no xerophthalmia ENT: no sore throat, no nodules palpated in throat, no dysphagia, no odynophagia, no hoarseness Cardiovascular: no CP/+ SOB/no palpitations/no leg swelling Respiratory: no cough/+ SOB/no wheezing Gastrointestinal: no N/no V/no D/no C/no acid reflux Musculoskeletal: + Muscle aches/+ joint aches Skin: + rash, no hair loss Neurological: no tremors/no numbness/no tingling/no dizziness, + HAs (not as pronounced as before after he reduced his work hours)  I reviewed pt's medications, allergies, PMH, social hx, family hx, and changes were documented in the history of present illness. Otherwise, unchanged from my initial visit note.  History   Social History Main Topics  . Smoking status: Never Smoker   . Smokeless tobacco: Not on file  . Alcohol Use:  No  . 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 aspirin 325 MG EC tablet 1/2 tablet    . Cholecalciferol (VITAMIN D) 2000 UNITS CAPS 1 tablet with a meal    . gabapentin (NEURONTIN) 800 MG tablet Take 800 mg by mouth 3 (three) times daily.   0  . Ginkgo Biloba 120 MG CAPS 1 capsule    . glucose blood (ONE TOUCH TEST STRIPS) test strip Use as instructed 3x a day.Dx: E11.40 200 each  5  . HUMULIN N 100 UNIT/ML injection Inject under skin 40 units before b'fast and 50 units before dinner - ReliOn 60 mL 1  . Insulin Pen Needle 31G X 8 MM MISC by Does not apply route.    . insulin regular (NOVOLIN R,HUMULIN R) 100 units/mL injection INJECT 10-30 units under skin 30 min before the 3 meals 60 mL 1  . levothyroxine (SYNTHROID, LEVOTHROID) 200 MCG tablet   4  . lisinopril-hydrochlorothiazide (PRINZIDE,ZESTORETIC) 20-12.5 MG per tablet TAKE (2) TABLETS DAILY.    Marland Kitchen MANGANESE PO 1 tablet    . Multiple Vitamins-Minerals (CENTRUM SILVER PO) Take 1 tablet by mouth daily.    . Omega 3 1000 MG CAPS 1 capsule with a meal     No current facility-administered medications for this visit.     Allergies  Allergen Reactions  . Metformin Hcl Nausea Only  . Penicillins Rash   FH: - DM in mother  PE: BP 132/80   Pulse 67   Ht 6\' 2"  (1.88 m)   Wt (!) 325 lb 6.4 oz (147.6 kg)   SpO2 96%   BMI 41.78 kg/m  Body mass index is 41.78 kg/m. Wt Readings from Last 3 Encounters:  03/04/17 (!) 325 lb 6.4 oz (147.6 kg)  12/03/16 (!) 328 lb (148.8 kg)  09/01/16 (!) 329 lb (149.2 kg)   Constitutional: overweight, in NAD Eyes: PERRLA, EOMI, no exophthalmos ENT: moist mucous membranes, no thyromegaly, no cervical lymphadenopathy Cardiovascular: RRR, No MRG Respiratory: CTA B Gastrointestinal: abdomen soft, NT, ND, BS+ Musculoskeletal: no deformities, strength intact in all 4 Skin: moist, warm, no rashes Neurological: no tremor with outstretched hands, DTR normal in all 4  ASSESSMENT: 1. 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 HL  PLAN:  1. Patient with long-standing, more uncontrolled diabetes at this visit due to dietary indiscretions during the holidays.  He is currently on a basal-bolus insulin regimen with NPH and regular insulin, and he adjusts the doses of regular insulin based on the sugars before meals and the size of his  meals.  He is doing a fairly good job with this, but sugars remain quite fluctuating, without consistent patterns, therefore, I cannot make changes in his regimen now.  He does not have any significant lows anymore and his hyperglycemic spikes are in the 200s usually, seldom in the 300s.  Whenever his sugars are high, he mentions that he most likely forgot to take the insulin. -We will continue the current insulin doses - I suggested to:  Patient Instructions   Please continue:  Insulin Before breakfast Before lunch Before dinner Bedtime  Regular 15-20 units 15-20 units 20-25 units 6-8 units if you have a snack with >15 g of carbs  Novolin NPH 40 - -  50    Please return in 3 months with your sugar log.    - today, HbA1c is 7.1% (higher) - continue checking sugars at different times of the day - check 3x a day, rotating checks - advised for yearly eye exams >> he is UTD - Return to clinic in 3 mo with sugar log    2. HL - He had elevated triglycerides levels per review of his lipid panel from 03/2016. - He continues on omega-3 fatty acids thousand milligrams twice a day  Philemon Kingdom, MD PhD [facility] Endocrinology

## 2017-04-08 DIAGNOSIS — R69 Illness, unspecified: Secondary | ICD-10-CM | POA: Diagnosis not present

## 2017-05-06 DIAGNOSIS — R110 Nausea: Secondary | ICD-10-CM | POA: Diagnosis not present

## 2017-05-06 DIAGNOSIS — R3989 Other symptoms and signs involving the genitourinary system: Secondary | ICD-10-CM | POA: Diagnosis not present

## 2017-05-06 DIAGNOSIS — H52223 Regular astigmatism, bilateral: Secondary | ICD-10-CM | POA: Diagnosis not present

## 2017-05-06 DIAGNOSIS — R195 Other fecal abnormalities: Secondary | ICD-10-CM | POA: Diagnosis not present

## 2017-05-06 DIAGNOSIS — H524 Presbyopia: Secondary | ICD-10-CM | POA: Diagnosis not present

## 2017-05-06 DIAGNOSIS — E119 Type 2 diabetes mellitus without complications: Secondary | ICD-10-CM | POA: Diagnosis not present

## 2017-05-06 DIAGNOSIS — H5213 Myopia, bilateral: Secondary | ICD-10-CM | POA: Diagnosis not present

## 2017-05-10 ENCOUNTER — Other Ambulatory Visit: Payer: Self-pay | Admitting: Family Medicine

## 2017-05-10 ENCOUNTER — Ambulatory Visit
Admission: RE | Admit: 2017-05-10 | Discharge: 2017-05-10 | Disposition: A | Discharge status: Home / Self Care | Payer: Medicare HMO | Source: Ambulatory Visit | Attending: Family Medicine | Admitting: Family Medicine

## 2017-05-10 MED ORDER — IOPAMIDOL (ISOVUE-300) INJECTION 61%
125.0000 mL | Freq: Once | INTRAVENOUS | Status: AC | PRN
  Administered 2017-05-10: 100 mL via INTRAVENOUS

## 2017-05-11 DIAGNOSIS — L299 Pruritus, unspecified: Secondary | ICD-10-CM | POA: Diagnosis not present

## 2017-05-11 DIAGNOSIS — K838 Other specified diseases of biliary tract: Secondary | ICD-10-CM | POA: Diagnosis not present

## 2017-05-14 DIAGNOSIS — C250 Malignant neoplasm of head of pancreas: Secondary | ICD-10-CM | POA: Diagnosis not present

## 2017-05-14 DIAGNOSIS — R69 Illness, unspecified: Secondary | ICD-10-CM | POA: Diagnosis not present

## 2017-05-14 DIAGNOSIS — G4730 Sleep apnea, unspecified: Secondary | ICD-10-CM | POA: Diagnosis not present

## 2017-05-14 DIAGNOSIS — E1042 Type 1 diabetes mellitus with diabetic polyneuropathy: Secondary | ICD-10-CM | POA: Diagnosis not present

## 2017-05-14 DIAGNOSIS — E1022 Type 1 diabetes mellitus with diabetic chronic kidney disease: Secondary | ICD-10-CM | POA: Diagnosis not present

## 2017-05-14 DIAGNOSIS — K869 Disease of pancreas, unspecified: Secondary | ICD-10-CM | POA: Diagnosis not present

## 2017-05-14 DIAGNOSIS — K8689 Other specified diseases of pancreas: Secondary | ICD-10-CM | POA: Diagnosis not present

## 2017-05-14 DIAGNOSIS — I10 Essential (primary) hypertension: Secondary | ICD-10-CM | POA: Diagnosis not present

## 2017-05-14 DIAGNOSIS — K831 Obstruction of bile duct: Secondary | ICD-10-CM | POA: Diagnosis not present

## 2017-05-14 DIAGNOSIS — E039 Hypothyroidism, unspecified: Secondary | ICD-10-CM | POA: Diagnosis not present

## 2017-05-14 DIAGNOSIS — Z9049 Acquired absence of other specified parts of digestive tract: Secondary | ICD-10-CM | POA: Diagnosis not present

## 2017-05-14 DIAGNOSIS — K838 Other specified diseases of biliary tract: Secondary | ICD-10-CM | POA: Diagnosis not present

## 2017-05-14 DIAGNOSIS — N189 Chronic kidney disease, unspecified: Secondary | ICD-10-CM | POA: Diagnosis not present

## 2017-05-14 DIAGNOSIS — I129 Hypertensive chronic kidney disease with stage 1 through stage 4 chronic kidney disease, or unspecified chronic kidney disease: Secondary | ICD-10-CM | POA: Diagnosis not present

## 2017-05-14 DIAGNOSIS — Z4689 Encounter for fitting and adjustment of other specified devices: Secondary | ICD-10-CM | POA: Diagnosis not present

## 2017-05-24 DIAGNOSIS — C250 Malignant neoplasm of head of pancreas: Secondary | ICD-10-CM | POA: Diagnosis not present

## 2017-05-24 DIAGNOSIS — R5381 Other malaise: Secondary | ICD-10-CM | POA: Diagnosis not present

## 2017-05-24 DIAGNOSIS — K429 Umbilical hernia without obstruction or gangrene: Secondary | ICD-10-CM | POA: Diagnosis not present

## 2017-05-24 DIAGNOSIS — Z6841 Body Mass Index (BMI) 40.0 and over, adult: Secondary | ICD-10-CM | POA: Diagnosis not present

## 2017-05-24 DIAGNOSIS — E46 Unspecified protein-calorie malnutrition: Secondary | ICD-10-CM | POA: Diagnosis not present

## 2017-05-24 DIAGNOSIS — K838 Other specified diseases of biliary tract: Secondary | ICD-10-CM | POA: Diagnosis not present

## 2017-05-24 DIAGNOSIS — Z87891 Personal history of nicotine dependence: Secondary | ICD-10-CM | POA: Diagnosis not present

## 2017-05-24 DIAGNOSIS — K869 Disease of pancreas, unspecified: Secondary | ICD-10-CM | POA: Diagnosis not present

## 2017-05-24 DIAGNOSIS — K831 Obstruction of bile duct: Secondary | ICD-10-CM | POA: Diagnosis not present

## 2017-05-28 DIAGNOSIS — C250 Malignant neoplasm of head of pancreas: Secondary | ICD-10-CM | POA: Diagnosis not present

## 2017-05-28 DIAGNOSIS — C259 Malignant neoplasm of pancreas, unspecified: Secondary | ICD-10-CM | POA: Diagnosis not present

## 2017-05-28 DIAGNOSIS — K869 Disease of pancreas, unspecified: Secondary | ICD-10-CM | POA: Diagnosis not present

## 2017-06-02 DIAGNOSIS — K8689 Other specified diseases of pancreas: Secondary | ICD-10-CM | POA: Diagnosis not present

## 2017-06-02 DIAGNOSIS — K831 Obstruction of bile duct: Secondary | ICD-10-CM | POA: Diagnosis not present

## 2017-06-02 DIAGNOSIS — C250 Malignant neoplasm of head of pancreas: Secondary | ICD-10-CM | POA: Diagnosis not present

## 2017-06-02 DIAGNOSIS — K869 Disease of pancreas, unspecified: Secondary | ICD-10-CM | POA: Diagnosis not present

## 2017-06-02 DIAGNOSIS — K838 Other specified diseases of biliary tract: Secondary | ICD-10-CM | POA: Diagnosis not present

## 2017-06-03 ENCOUNTER — Ambulatory Visit (INDEPENDENT_AMBULATORY_CARE_PROVIDER_SITE_OTHER): Payer: Medicare HMO | Admitting: Internal Medicine

## 2017-06-03 ENCOUNTER — Encounter: Payer: Self-pay | Admitting: Internal Medicine

## 2017-06-03 VITALS — BP 128/68 | HR 80 | Ht 74.0 in | Wt 311.8 lb

## 2017-06-03 DIAGNOSIS — E785 Hyperlipidemia, unspecified: Secondary | ICD-10-CM | POA: Diagnosis not present

## 2017-06-03 DIAGNOSIS — E1165 Type 2 diabetes mellitus with hyperglycemia: Secondary | ICD-10-CM

## 2017-06-03 DIAGNOSIS — E1142 Type 2 diabetes mellitus with diabetic polyneuropathy: Secondary | ICD-10-CM

## 2017-06-03 LAB — POCT GLYCOSYLATED HEMOGLOBIN (HGB A1C)

## 2017-06-03 MED ORDER — HUMULIN N 100 UNIT/ML ~~LOC~~ SUSP
SUBCUTANEOUS | 1 refills | Status: AC
Start: 2017-06-03 — End: ?

## 2017-06-03 NOTE — Patient Instructions (Addendum)
Please change:  Insulin Before breakfast Before lunch Before dinner Bedtime  Regular 15-20 units 20-25 units 20-25 units 6-8 units if you have a snack with >15 g of carbs  Novolin NPH 40 - - 40   Please return in 3-4 months with your sugar log.

## 2017-06-03 NOTE — Progress Notes (Signed)
Patient ID: Steven Gordon, male   DOB: [birth year], 77 y.o.   MRN: [id number]  HPI: Steven Gordon is a 77 y.o.-year-old male, returning for f/u for DM2, dx in 2000, insulin-dependent, uncontrolled, with complications (PN, CKD). Last visit 3 mo ago.  He was just dx'ed with Pancreatic cancer. He presented with dark urine, nausea, jaundice.  He was told that this is not operable.  He had a CT scan of the abdomen yesterday at [facility] and we will reviewed the report together: The pancreatic cancer is localized, not metastatic.  He will most likely start palliative chemotherapy.  He is telling me that he was given approximately 6 months to live.  He lost 14 lbs since last visit >> nausea >> could not eat.  Sugars did not drop significantly, except for one low at 58 in a.m.  Last hemoglobin A1c was: Lab Results  Component Value Date   HGBA1C 7.1 03/04/2017   HGBA1C 6.3 12/03/2016   HGBA1C 6.6 09/01/2016  04/03/2016: HbA1c 7.2% 07/18/2015: HbA1c 6.7% 10/12/2014: HbA1c 9.6% 02/20/2014: 9.7%  We had to use N and R insulin b/c $:  Insulin Before breakfast Before lunch Before dinner Bedtime  Regular 15-20 units 20-25 units 20-25 units 6-8 units if you have a snack with >15 g of carbs  Novolin NPH 40 - - 50   We stopped Metformin 2/2 nausea.  Pt checks his sugars once a day: - am:67, 85, 100-189, 220, 276 )200s >> missed insulin) >> 59, 104-166, 296 - 2h after b'fast: n/c >> 70 (felt poorly) >> n/c >> 68 >> n/c - before lunch:  66, 84-135, 249 >> 56, 60-163 >>  76-215, 232 >> 123-202, 239, 293 (no insulin) - 2h after lunch: n/c >> 143 >> 184, 189, 219 >> 61, 229 >> n/c  - before dinner: 85-195, 206, 249 (snack) >> 73-215, 245, 357 >> 105-232 - 2h after dinner:  236-271 >> n/c >> 66-279 >> n/c - bedtime:   107, 174-274, 337 >> 121-322 >> 214-268 - nighttime: n/c >> 67 >> n/c Lowest sugar was67; he has hypoglycemia awareness in the 70s Highest sugar was 357  Glucometer: One Touch  Ultra  Pt's meals are: - Breakfast: gravy biscuit ([employer]) - Lunch: meat and veggies - Dinner: stir fry or meat and veggies - Snacks: 1 at night - dessert 3x a week: vanilla wafers  - + mild CKD, last BUN/creatinine:  05/24/2017: Glu 117, BUN/Cr 23/1.20, GFR 58 Lab Results  Component Value Date   BUN 25 07/22/2016   CREATININE 1.30 (H) 07/22/2016  10/12/2014: Glucose 259, BUN/creatinine 24/1.34, sodium 135, otherwise normal On lisinopril. -+ HL; last set of lipids: 04/03/2016: 192/335/34/91 10/12/2014: 179/264/33/93  Lab Results  Component Value Date   CHOL 177 07/09/2014   HDL 14 (A) 07/09/2014   TRIG 475 (A) 07/09/2014  On omega 3 FAs. - last eye exam was in 04/2016: No DR. Dr Bing Plume.  - No numbness and tingling in his feet.  On Neurontin 800 mg 3 times a day  ROS: Constitutional: no weight gain/no weight loss, + fatigue, no subjective hyperthermia, no subjective hypothermia Eyes: no blurry vision, no xerophthalmia ENT: no sore throat, no nodules palpated in throat, no dysphagia, no odynophagia, no hoarseness Cardiovascular: no CP/no SOB/no palpitations/no leg swelling Respiratory: no cough/no SOB/no wheezing Gastrointestinal: + N/no V/no D/no C/no acid reflux Musculoskeletal: no muscle aches/no joint aches Skin: no rashes, no hair loss, + multiple healed scratches due to pruritic jaundice Neurological: no  tremors/no numbness/no tingling/no dizziness  I reviewed pt's medications, allergies, PMH, social hx, family hx, and changes were documented in the history of present illness. Otherwise, unchanged from my initial visit note.  History   Social History Main Topics  . Smoking status: Never Smoker   . Smokeless tobacco: Not on file  . Alcohol Use: No  . 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  aspirin 325 MG EC tablet 1/2 tablet    . Cholecalciferol (VITAMIN D) 2000 UNITS CAPS 1 tablet with a meal    . gabapentin (NEURONTIN) 800 MG tablet Take 800 mg by mouth 3 (three) times daily.   0  . Ginkgo Biloba 120 MG CAPS 1 capsule    . glucose blood (ONE TOUCH TEST STRIPS) test strip Use as instructed 3x a day.Dx: E11.40 200 each 5  . HUMULIN N 100 UNIT/ML injection Inject under skin 40 units before b'fast and 50 units before dinner - ReliOn 60 mL 1  . Insulin Pen Needle 31G X 8 MM MISC by Does not apply route.    . insulin regular (NOVOLIN R,HUMULIN R) 100 units/mL injection INJECT 10-30 units under skin 30 min before the 3 meals 60 mL 1  . levothyroxine (SYNTHROID, LEVOTHROID) 200 MCG tablet   4  . lisinopril-hydrochlorothiazide (PRINZIDE,ZESTORETIC) 20-12.5 MG per tablet TAKE (2) TABLETS DAILY.    Marland Kitchen MANGANESE PO 1 tablet    . Multiple Vitamins-Minerals (CENTRUM SILVER PO) Take 1 tablet by mouth daily.    . Omega 3 1000 MG CAPS 1 capsule with a meal     No current facility-administered medications for this visit.     Allergies  Allergen Reactions  . Metformin Hcl Nausea Only  . Penicillins Rash   FH: - DM in mother  PE: BP 128/68   Pulse 80   Ht 6\' 2"  (1.88 m)   Wt (!) 311 lb 12.8 oz (141.4 kg)   SpO2 97%   BMI 40.03 kg/m  Body mass index is 40.03 kg/m. Wt Readings from Last 3 Encounters:  06/03/17 (!) 311 lb 12.8 oz (141.4 kg)  03/04/17 (!) 325 lb 6.4 oz (147.6 kg)  12/03/16 (!) 328 lb (148.8 kg)   Constitutional: overweight, in NAD Eyes: PERRLA, EOMI, no exophthalmos ENT: moist mucous membranes, no thyromegaly, no cervical lymphadenopathy Cardiovascular: RRR, No MRG Respiratory: CTA B Gastrointestinal: abdomen soft, NT, ND, BS+ Musculoskeletal: no deformities, strength intact in all 4 Skin: moist, warm, no rashes Neurological: no tremor with outstretched hands, DTR normal in all 4  ASSESSMENT: 1. DM2, insulin-dependent, uncontrolled, with complications - PN  - on Gabapentin - mild CKD  2. HL  PLAN:  1. Patient with long-standing, uncontrolled type 2 diabetes, with worse control at last visit due to dietary indiscretions during the holidays.  He continues on a basal-bolus insulin regimen with NPH and regular insulin and he adjust the doses of regular insulin based on the sugars before meal and the size of his meals.  He is doing a fairly good job with this but sugars remain fluctuating, without consistent patterns.  He does have higher sugars, in the 200s usually if he forgets insulin doses.  We did not change his insulin regimen at last visit. - Since last visit, he developed jaundice, nausea, and decreased appetite  and he lost 14 pounds.  Unfortunately, the diagnosis is pancreatic cancer.  He believes he will start chemotherapy and we discussed that he may have steroids (dexamethasone) at the time of the infusions so his sugars may increase.  However, for now, he is not eating well, so even though his sugars are mostly above target, I am reticent to increase his insulin doses.  In fact, since he is telling me that he has to eat a snack at night to avoid dropping his sugars too low in the morning and since he already have a 58 CBG, I advised him to reduce NPH at night from 50 units to 40 units.  I advised him not to get the evening snack, if possible, to see if his sugars continue to drop during the night. - No other changes are necessary for now, but I advised him to let h me know about his sugars before next visit, if he has significant highs or lows. - I suggested to:  Patient Instructions   Please continue:  Insulin Before breakfast Before lunch Before dinner Bedtime  Regular 15-20 units 20-25 units 20-25 units 6-8 units if you have a snack with >15 g of carbs  Novolin NPH 40 - - 40   Please return in 3-4 months with your sugar log.   - today, HbA1c is 6.3 % (improved, but not well correlating with sugars at home) - continue checking sugars at  different times of the day - check 3x a day, rotating checks - advised for yearly eye exams >> he is not UTD - Return to clinic in 3-4 mo with sugar log    2. HL  -reviewed latest lipid panel from 03/2016: Triglycerides elevated -Continues on omega-3 fatty acids 1000 mg twice a day - He is due for another lipid panel, but I do not feel this is mandatory at this point  Philemon Kingdom, MD PhD [facility] Endocrinology

## 2017-06-07 DIAGNOSIS — C250 Malignant neoplasm of head of pancreas: Secondary | ICD-10-CM | POA: Diagnosis not present

## 2017-06-10 DIAGNOSIS — G4730 Sleep apnea, unspecified: Secondary | ICD-10-CM | POA: Diagnosis not present

## 2017-06-10 DIAGNOSIS — E039 Hypothyroidism, unspecified: Secondary | ICD-10-CM | POA: Diagnosis not present

## 2017-06-10 DIAGNOSIS — C259 Malignant neoplasm of pancreas, unspecified: Secondary | ICD-10-CM | POA: Diagnosis not present

## 2017-06-10 DIAGNOSIS — Z7189 Other specified counseling: Secondary | ICD-10-CM | POA: Diagnosis not present

## 2017-06-10 DIAGNOSIS — E782 Mixed hyperlipidemia: Secondary | ICD-10-CM | POA: Diagnosis not present

## 2017-06-10 DIAGNOSIS — N183 Chronic kidney disease, stage 3 (moderate): Secondary | ICD-10-CM | POA: Diagnosis not present

## 2017-06-10 DIAGNOSIS — E1165 Type 2 diabetes mellitus with hyperglycemia: Secondary | ICD-10-CM | POA: Diagnosis not present

## 2017-06-10 DIAGNOSIS — I10 Essential (primary) hypertension: Secondary | ICD-10-CM | POA: Diagnosis not present

## 2017-06-10 DIAGNOSIS — G629 Polyneuropathy, unspecified: Secondary | ICD-10-CM | POA: Diagnosis not present

## 2017-06-10 DIAGNOSIS — R69 Illness, unspecified: Secondary | ICD-10-CM | POA: Diagnosis not present

## 2017-07-24 DEATH — deceased

## 2017-10-18 ENCOUNTER — Ambulatory Visit: Payer: Medicare HMO | Admitting: Internal Medicine

## 2017-10-18 DIAGNOSIS — Z0289 Encounter for other administrative examinations: Secondary | ICD-10-CM

## 2017-10-18 NOTE — Progress Notes (Deleted)
Patient ID: Steven Gordon, male   DOB: [birth year], 77 y.o.   MRN: [id number]  HPI: Steven Gordon is a 77 y.o.-year-old male, returning for f/u for DM2, dx in 2000, insulin-dependent, uncontrolled, with complications (PN, CKD). Last visit 4.5 mo ago.  He was diagnosed with pancreatic cancer before last visit.  He was told that this is not operable.  Undergoing palliative chemotherapy.  At last visit with me that he was given 6 months to live.  Last hemoglobin A1c was: Lab Results  Component Value Date   HGBA1C 6.3% 06/03/2017   HGBA1C 7.1 03/04/2017   HGBA1C 6.3 12/03/2016  04/03/2016: HbA1c 7.2% 07/18/2015: HbA1c 6.7% 10/12/2014: HbA1c 9.6% 02/20/2014: 9.7%  We had to use N and R insulin b/c $:  Insulin Before breakfast Before lunch Before dinner Bedtime  Regular 15-20 units 20-25 units 20-25 units 6-8 units if you have a snack with >15 g of carbs  Novolin NPH 40 - - 40   We had to stop metformin due to nausea  Pt checks his sugars once a day: - am:67, 85, 100-189, 220, 276 >> 200s >> 59, 104-166, 296 - 2h after b'fast: n/c >> 70 (felt poorly) >> n/c >> 68 >> n/c - before lunch:   76-215, 232 >> 123-202, 239, 293 (no insulin) - 2h after lunch: 184, 189, 219 >> 61, 229 >> n/c  - before dinner: 73-215, 245, 357 >> 105-232 - 2h after dinner:  236-271 >> n/c >> 66-279 >> n/c - bedtime:   107, 174-274, 337 >> 121-322 >> 214-268 - nighttime: n/c >> 67 >> n/c Lowest sugar was 67 >> ***; he has hypoglycemia awareness in the 70s. Highest sugar was 357 >> ***  Glucometer: One Touch Ultra  Pt's meals are: - Breakfast: gravy biscuit ([employer]) - Lunch: meat and veggies - Dinner: stir fry or meat and veggies - Snacks: 1 at night - dessert 3x a week: vanilla wafers  -+ Mild CKD, last BUN/creatinine:  05/24/2017: Glu 117, BUN/Cr 23/1.20, GFR 58 Lab Results  Component Value Date   BUN 25 07/22/2016   CREATININE 1.30 (H) 07/22/2016  10/12/2014: Glucose 259, BUN/creatinine 24/1.34,  sodium 135, otherwise normal On lisinopril. -+ HL; last set of lipids: 04/03/2016: 192/335/34/91 10/12/2014: 179/264/33/93  Lab Results  Component Value Date   CHOL 177 07/09/2014   HDL 14 (A) 07/09/2014   TRIG 475 (A) 07/09/2014  On omega-3 fatty acids. - last eye exam was in 04/2016: no DR. Dr Bing Plume.  - no numbness and tingling in his feet.  On Neurontin 800 mg 3 times a day  ROS: Constitutional: no weight gain/no weight loss, no fatigue, no subjective hyperthermia, no subjective hypothermia Eyes: no blurry vision, no xerophthalmia ENT: no sore throat, no nodules palpated in throat, no dysphagia, no odynophagia, no hoarseness Cardiovascular: no CP/no SOB/no palpitations/no leg swelling Respiratory: no cough/no SOB/no wheezing Gastrointestinal: no N/no V/no D/no C/no acid reflux Musculoskeletal: no muscle aches/no joint aches Skin: no rashes, no hair loss Neurological: no tremors/no numbness/no tingling/no dizziness  I reviewed pt's medications, allergies, PMH, social hx, family hx, and changes were documented in the history of present illness. Otherwise, unchanged from my initial visit note.  History   Social History Main Topics  . Smoking status: Never Smoker   . Smokeless tobacco: Not on file  . Alcohol Use: No  . Drug Use: No   Social History Narrative   [employer] logistics/support/administrative officer   Married   No children  Caffeine use-yes   No diet or exercise   Current Outpatient Medications  Medication Sig Dispense Refill  . aspirin 325 MG EC tablet 1/2 tablet    . Cholecalciferol (VITAMIN D) 2000 UNITS CAPS 1 tablet with a meal    . diphenhydrAMINE (BENADRYL) 25 MG tablet Take 25 mg by mouth every 6 (six) hours as needed.    . gabapentin (NEURONTIN) 800 MG tablet Take 800 mg by mouth 3 (three) times daily.   0  . Ginkgo Biloba 120 MG CAPS 1 capsule    . glucose blood (ONE TOUCH TEST STRIPS) test strip Use as instructed 3x a day.Dx: E11.40 200 each 5  . HUMULIN N 100  UNIT/ML injection Inject under skin 40 units before b'fast and 40 units before dinner - ReliOn 60 mL 1  . Insulin Pen Needle 31G X 8 MM MISC by Does not apply route.    . insulin regular (NOVOLIN R,HUMULIN R) 100 units/mL injection INJECT 10-30 units under skin 30 min before the 3 meals 60 mL 1  . levothyroxine (SYNTHROID, LEVOTHROID) 200 MCG tablet   4  . lisinopril-hydrochlorothiazide (PRINZIDE,ZESTORETIC) 20-12.5 MG per tablet TAKE (2) TABLETS DAILY.    Marland Kitchen MANGANESE PO 1 tablet    . Multiple Vitamins-Minerals (CENTRUM SILVER PO) Take 1 tablet by mouth daily.    . Omega 3 1000 MG CAPS 1 capsule with a meal    . ondansetron (ZOFRAN-ODT) 4 MG disintegrating tablet Take 4 mg by mouth every 8 (eight) hours as needed for nausea or vomiting.     No current facility-administered medications for this visit.     Allergies  Allergen Reactions  . Metformin Hcl Nausea Only  . Penicillins Rash   FH: - DM in mother  PE: There were no vitals taken for this visit. There is no height or weight on file to calculate BMI. Wt Readings from Last 3 Encounters:  06/03/17 (!) 311 lb 12.8 oz (141.4 kg)  03/04/17 (!) 325 lb 6.4 oz (147.6 kg)  12/03/16 (!) 328 lb (148.8 kg)   Constitutional: overweight, in NAD Eyes: PERRLA, EOMI, no exophthalmos ENT: moist mucous membranes, no thyromegaly, no cervical lymphadenopathy Cardiovascular: RRR, No MRG Respiratory: CTA B Gastrointestinal: abdomen soft, NT, ND, BS+ Musculoskeletal: no deformities, strength intact in all 4 Skin: moist, warm, no rashes Neurological: no tremor with outstretched hands, DTR normal in all 4  ASSESSMENT: 1. 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 HL  PLAN:  1. Patient with pain, uncontrolled, type 2 diabetes, with improved control at last visit, however, unfortunately, this was due to not being able to eat due to recent diagnosis of pancreatic cancer.  He had lost 14 pounds  before last visit.  We adjusted his NPH insulin at night as he had one low blood sugar at 58 before last visit.  - At last visit, his HbA1c was 6.3%, decreased, but not correlating well with his sugars at home - I suggested to:  Patient Instructions   Please continue:  Insulin Before breakfast Before lunch Before dinner Bedtime  Regular 15-20 units 20-25 units 20-25 units 6-8 units if you have a snack with >15 g of carbs  Novolin NPH 40 - - 40   Please return in 4 months with your sugar log.   - today, HbA1c is 7%  - continue checking sugars at different times of the day - check 1x a day, rotating checks - advised for yearly  eye exams >> he is UTD - Return to clinic in 4 mo with sugar log    2. HL  - Reviewed latest lipid panel from 03/2016 (latest): TG elevated - Continues omega 3 FA 1000 mg 2x a day without side effects. - neewds a new lipid panel  Philemon Kingdom, MD PhD [facility] Endocrinology

## 2019-04-04 IMAGING — NM NM MISC PROCEDURE
9 series · 54 of 54 positions shown · non-contrast
Comparison: none

[Series 1: wbr_s-proj_st wbr stress-gsp · 6.40mm/px · 6 of 512 frames shown]
[frame 43/512]
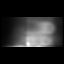
[frame 128/512]
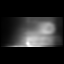
[frame 214/512]
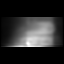
[frame 299/512]
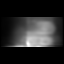
[frame 384/512]
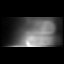
[frame 470/512]
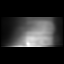

[Series 1: wbr stress-gsp · 6.40mm/px · 6 of 446 frames shown]
[frame 38/446  full-range]
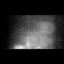
[frame 112/446  full-range]
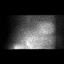
[frame 186/446  full-range]
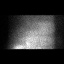
[frame 261/446  full-range]
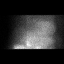
[frame 335/446  full-range]
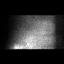
[frame 409/446  full-range]
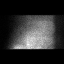

[Series 1: stress sax gs · 6.4mm · 6.40mm/px · 6 of 184 frames shown]
[frame 16/184]
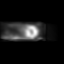
[frame 46/184]
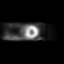
[frame 77/184]
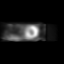
[frame 108/184]
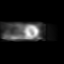
[frame 138/184]
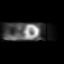
[frame 169/184]
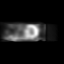

[Series 1: stress sax · 6.4mm · 6.40mm/px · 6 of 23 frames shown]
[frame 2/23]
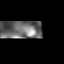
[frame 6/23]
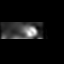
[frame 10/23]
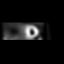
[frame 14/23]
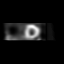
[frame 18/23]
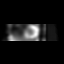
[frame 22/23]
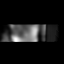

[Series 2: wbr stress-sum-em · 6.40mm/px · 6 of 64 frames shown]
[frame 6/64]
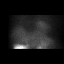
[frame 16/64]
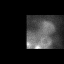
[frame 27/64]
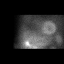
[frame 38/64]
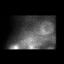
[frame 48/64]
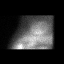
[frame 59/64]
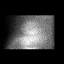

[Series 2: wbr_s-proj_st wbr stress-sum-em · 6.40mm/px · 6 of 64 frames shown]
[frame 6/64]
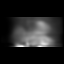
[frame 16/64]
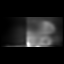
[frame 27/64]
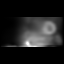
[frame 38/64]
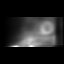
[frame 48/64]
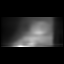
[frame 59/64]
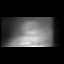

[Series 3: rest sax · 6.4mm · 6.40mm/px · 6 of 23 frames shown]
[frame 2/23]
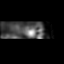
[frame 6/23]
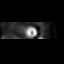
[frame 10/23]
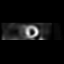
[frame 14/23]
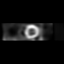
[frame 18/23]
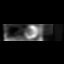
[frame 22/23]
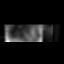

[Series 3: wbr_r-proj_st wbr rest · 6.40mm/px · 6 of 64 frames shown]
[frame 6/64]
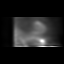
[frame 16/64]
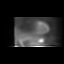
[frame 27/64]
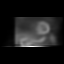
[frame 38/64]
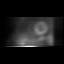
[frame 48/64]
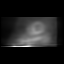
[frame 59/64]
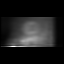

[Series 3: wbr rest · 6.40mm/px · 6 of 64 frames shown]
[frame 6/64]
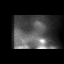
[frame 16/64]
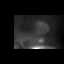
[frame 27/64]
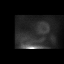
[frame 38/64]
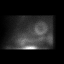
[frame 48/64]
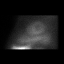
[frame 59/64]
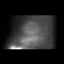

[54 of 54 positions shown; findings below may reference images not displayed]

Canned report from images found in remote index.

Refer to host system for actual result text.

## 2019-04-11 IMAGING — DX DG CHEST 2V
2 series · 2 of 2 positions shown · non-contrast
Comparison: 06/18/2016

CLINICAL DATA: Dyspnea on exertion

EXAM:
CHEST  2 VIEW

[dg chest 2 view (1 of 2)]
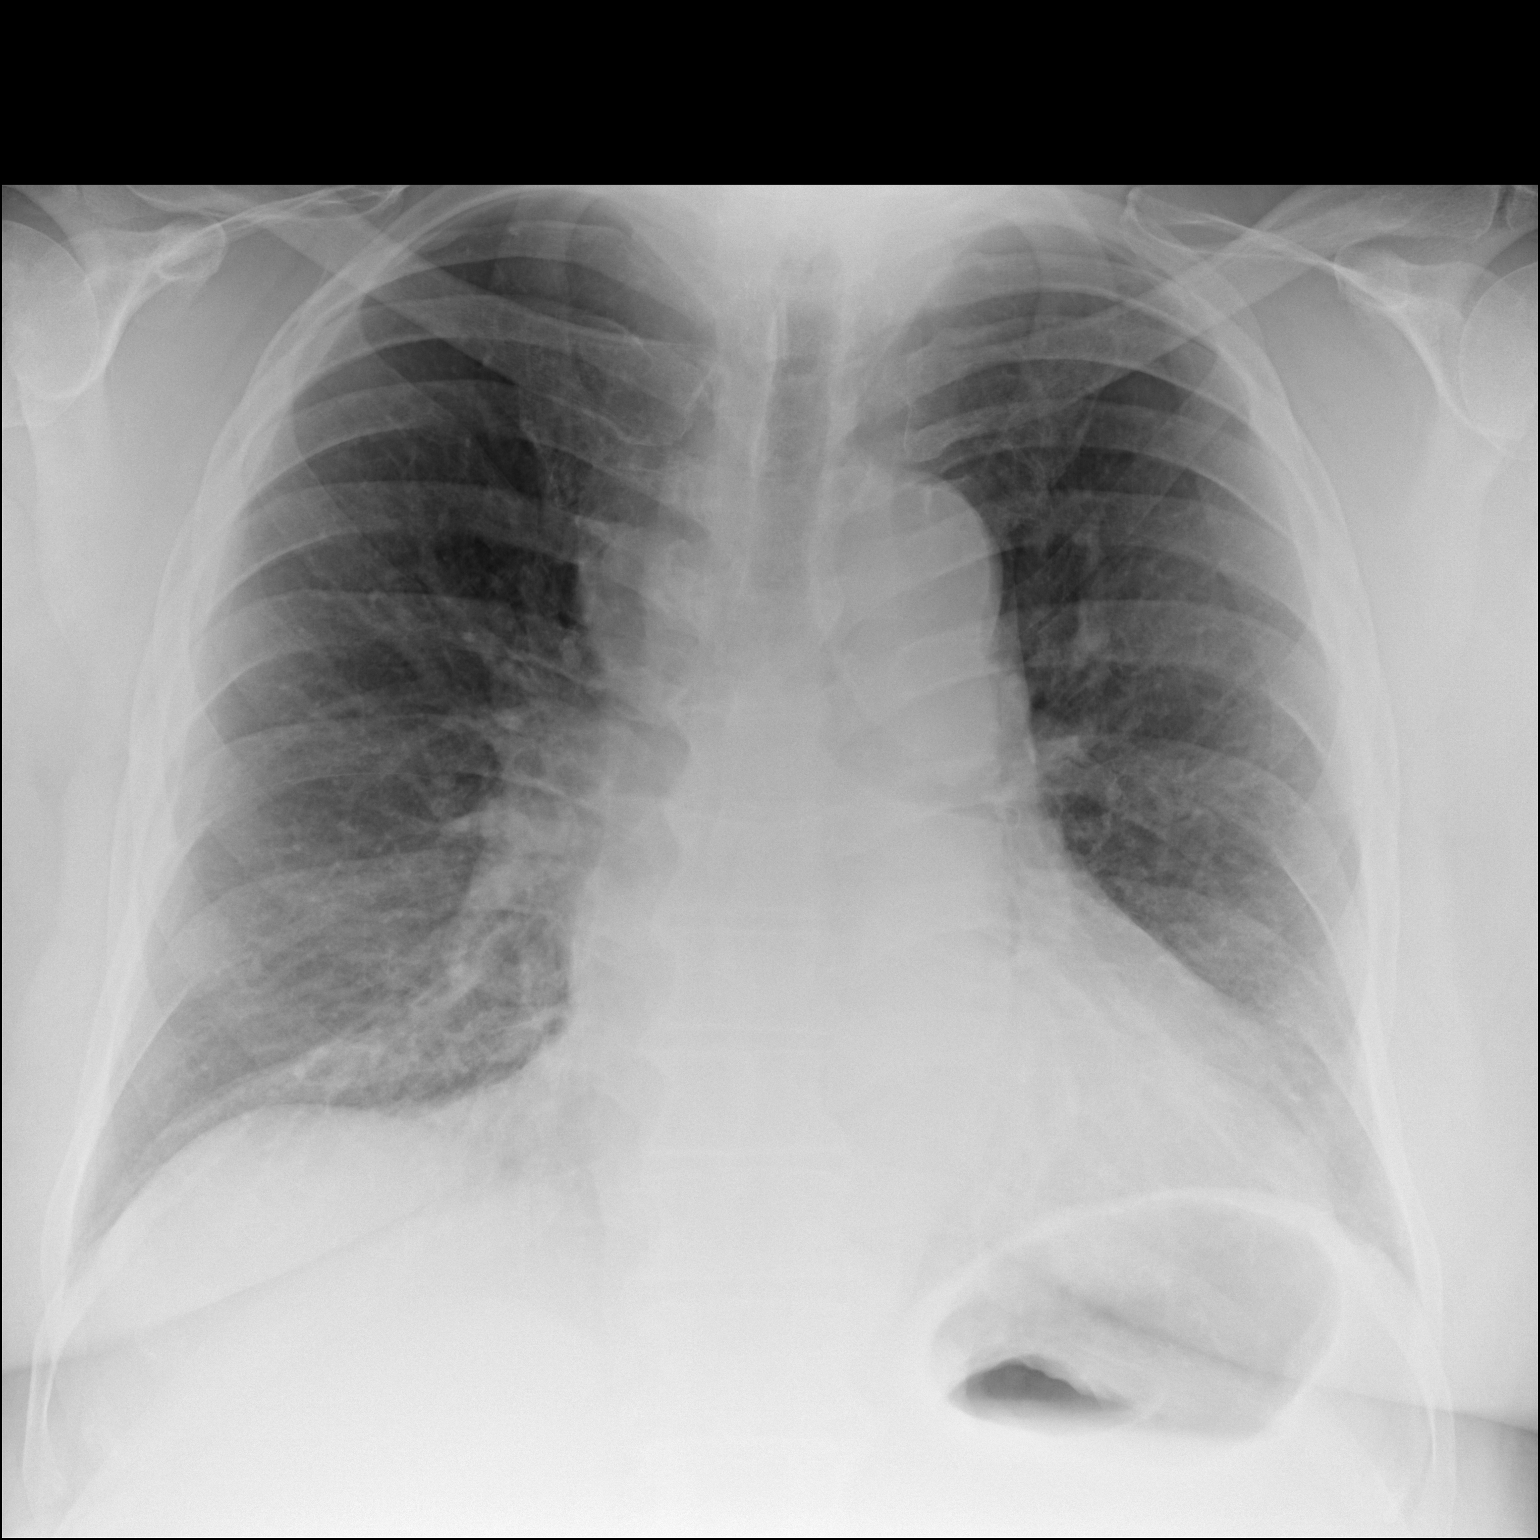

[dg chest 2 view (2 of 2)]
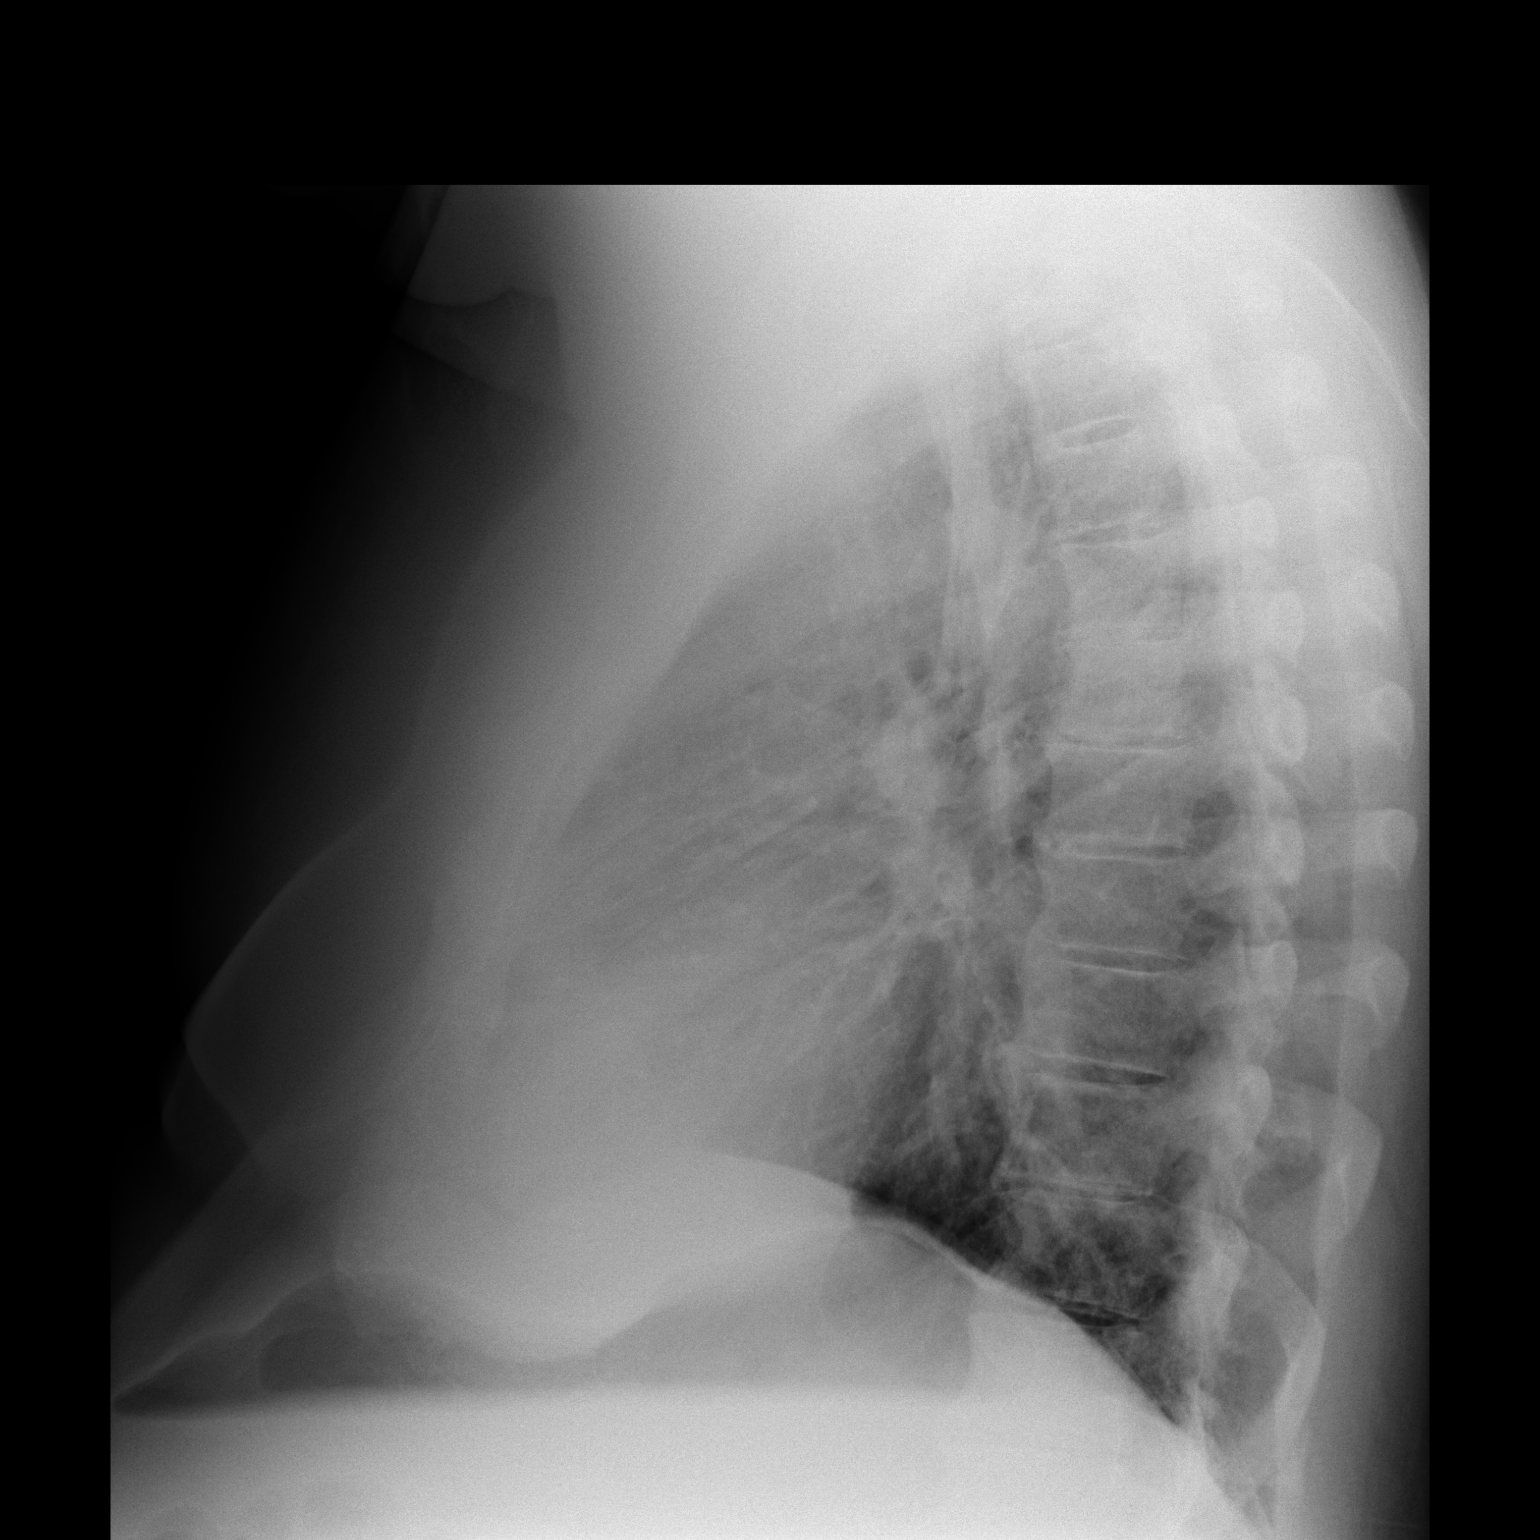

[2 of 2 positions shown; findings below may reference images not displayed]

FINDINGS: The heart size and mediastinal contours are within normal limits.
Both lungs are clear. The visualized skeletal structures are
unremarkable.
IMPRESSION: No active cardiopulmonary disease.
